# Patient Record
Sex: Male | Born: 1970 | Race: White | Hispanic: No | Marital: Married | Smoking: Current every day smoker
Health system: Southern US, Community
[De-identification: ages and names within clinical notes are randomized; demographics above are authoritative.]

## PROBLEM LIST (undated history)

## (undated) DIAGNOSIS — S43006A Unspecified dislocation of unspecified shoulder joint, initial encounter: Secondary | ICD-10-CM

## (undated) DIAGNOSIS — F1020 Alcohol dependence, uncomplicated: Secondary | ICD-10-CM

## (undated) HISTORY — PX: APPENDECTOMY: SHX54

---

## 2001-08-06 ENCOUNTER — Ambulatory Visit (HOSPITAL_COMMUNITY): Admission: RE | Admit: 2001-08-06 | Discharge: 2001-08-06 | Payer: Self-pay | Admitting: Internal Medicine

## 2001-08-06 ENCOUNTER — Encounter: Payer: Self-pay | Admitting: Internal Medicine

## 2015-07-13 ENCOUNTER — Emergency Department (HOSPITAL_COMMUNITY)
Admission: EM | Admit: 2015-07-13 | Discharge: 2015-07-13 | Disposition: A | Discharge status: Home / Self Care | Payer: Managed Care, Other (non HMO) | Attending: Emergency Medicine | Admitting: Emergency Medicine

## 2015-07-13 ENCOUNTER — Encounter (HOSPITAL_COMMUNITY): Payer: Self-pay | Admitting: Emergency Medicine

## 2015-07-13 ENCOUNTER — Emergency Department (HOSPITAL_COMMUNITY): Payer: Managed Care, Other (non HMO)

## 2015-07-13 DIAGNOSIS — X58XXXA Exposure to other specified factors, initial encounter: Secondary | ICD-10-CM | POA: Diagnosis not present

## 2015-07-13 DIAGNOSIS — R000 Tachycardia, unspecified: Secondary | ICD-10-CM | POA: Insufficient documentation

## 2015-07-13 DIAGNOSIS — Y998 Other external cause status: Secondary | ICD-10-CM | POA: Insufficient documentation

## 2015-07-13 DIAGNOSIS — F17200 Nicotine dependence, unspecified, uncomplicated: Secondary | ICD-10-CM | POA: Diagnosis not present

## 2015-07-13 DIAGNOSIS — S4991XA Unspecified injury of right shoulder and upper arm, initial encounter: Secondary | ICD-10-CM | POA: Diagnosis present

## 2015-07-13 DIAGNOSIS — M24411 Recurrent dislocation, right shoulder: Secondary | ICD-10-CM | POA: Insufficient documentation

## 2015-07-13 DIAGNOSIS — Y9389 Activity, other specified: Secondary | ICD-10-CM | POA: Diagnosis not present

## 2015-07-13 DIAGNOSIS — Y9289 Other specified places as the place of occurrence of the external cause: Secondary | ICD-10-CM | POA: Insufficient documentation

## 2015-07-13 HISTORY — DX: Unspecified dislocation of unspecified shoulder joint, initial encounter: S43.006A

## 2015-07-13 MED ORDER — IBUPROFEN 800 MG PO TABS
800.0000 mg | ORAL_TABLET | Freq: Once | ORAL | Status: AC
  Administered 2015-07-13: 800 mg via ORAL
  Filled 2015-07-13: qty 1

## 2015-07-13 NOTE — ED Notes (Signed)
Patient states he was stretching at home and dislocated his right shoulder. States he has history of same.

## 2015-07-13 NOTE — Discharge Instructions (Signed)

## 2015-07-13 NOTE — ED Provider Notes (Signed)
CSN: [id number]     Arrival date & time 07/13/15  2136 History  By signing my name below, I, [facility], attest that this documentation has been prepared under the direction and in the presence of Eber Hong, MD. Electronically Signed: Randell Patient, ED Scribe. 07/13/2015. 10:03 PM.   Chief Complaint  Patient presents with  . Shoulder Injury   The history is provided by the patient. No language interpreter was used.   HPI Comments: Maxwell Rosales is a 45 y.o. male with an hx of multiple shoulder dislocations who presents to the Emergency Department complaining of constant, moderately painful, worse with movement, right shoulder injury that occurred earlier today. Patient reports that he was sitting on the cough stretching his arm when he felt his right shoulder pop out of place, followed immediately by pain. Pain is worse with movement. Improved by keeping still.  Past Medical History  Diagnosis Date  . Shoulder dislocation    Past Surgical History  Procedure Laterality Date  . Appendectomy     History reviewed. No pertinent family history. Social History  Substance Use Topics  . Smoking status: Current Every Day Smoker  . Smokeless tobacco: None  . Alcohol Use: Yes     Comment: occasionally    Review of Systems  Musculoskeletal: Positive for arthralgias (right shoulder).  All other systems reviewed and are negative.     Allergies  Review of patient's allergies indicates no known allergies.  Home Medications   Prior to Admission medications   Not on File   BP 145/100 mmHg  Pulse 121  Temp(Src) 98.4 F (36.9 C) (Oral)  Resp 24  Ht 5\' 11"  (1.803 m)  Wt 185 lb (83.915 kg)  BMI 25.81 kg/m2  SpO2 90% Physical Exam  Constitutional: He appears well-developed and well-nourished. No distress.  HENT:  Head: Normocephalic and atraumatic.  Mouth/Throat: Oropharynx is clear and moist. No oropharyngeal exudate.  Eyes: Conjunctivae and EOM are normal.  Pupils are equal, round, and reactive to light. Right eye exhibits no discharge. Left eye exhibits no discharge. No scleral icterus.  Neck: Normal range of motion. Neck supple. No JVD present. No thyromegaly present.  Cardiovascular: Regular rhythm, normal heart sounds and intact distal pulses.  Tachycardia present.  Exam reveals no gallop and no friction rub.   No murmur heard. Strong pulses of the right radial. Tachycardic.  Pulmonary/Chest: Effort normal and breath sounds normal. No respiratory distress. He has no wheezes. He has no rales.  Abdominal: Soft. Bowel sounds are normal. He exhibits no distension and no mass. There is no tenderness.  Musculoskeletal: He exhibits tenderness. He exhibits no edema.  R shoulder with deformity c/w anterior, inferior shoulder dislocatin - holding the shoulder internally rotated and hanging in front of him - resists any movement due to severe pain  Lymphadenopathy:    He has no cervical adenopathy.  Neurological: He is alert. Coordination normal.  Skin: Skin is warm and dry. No rash noted. He is not diaphoretic. No erythema.  Psychiatric: He has a normal mood and affect. His behavior is normal.  Nursing note and vitals reviewed.   ED Course  Procedures  Shoulder Dislocation  DIAGNOSTIC STUDIES: Oxygen Saturation is 90% on RA, low by my interpretation.    COORDINATION OF CARE: 10:03 PM Per formed right shoulder retraction. Will order sling. Discussed treatment plan with pt at bedside and pt agreed to plan.  Labs Review Labs Reviewed - No data to display  Imaging Review No results  found. I have personally reviewed and evaluated these images and lab results as part of my medical decision-making.  Procedure:  Dislocation Reduction of right Shoulder  Consent:  Description of the procedures as well as Risks of procedure as well as the alternatives and risks of each were explained to the (patient/caregiver).  who has verbally expressed their  understanding.   verbal consent given by  the patient.  Imaging reviewed, anatomic site of dislocation identified and marked, Patient identity confirmed by arm band and with hospital identification number as well as verbally with patient.  Time Out performed.  Patient was prepped and draped in the usual sterile fashion. Sedation used No..  Sedation type: Moderate.  Type of Sedation used: none. Total sedation time: none .  Reduction method: traction.  Vitals were monitored through the procedure.  Complications: none.  Pt tolerated the procedure without complaints and returned to baseline without difficulty.  Post reduction images were obtained confirming successful reduction of dislocation.  Immobilization applied, Neurovascular status reevaluated and is good with normal pulses, sensation and good capillary refill of the affected extremity.   MDM   Final diagnoses:  None    Well appearing on d/c - initially xray showed that the pt had dislocation - however we had successful reduction clinically on exam and he was able to move the shoulder at baseline after the reduction attempt - no medication was necessary and the pt was immobilized with a sling - normal neurovasc exam after reduction - can f/u with ortho -   I personally performed the services described in this documentation, which was scribed in my presence. The recorded information has been reviewed and is accurate.    Eber HongBrian Elvin Banker, MD 07/15/15 [phone]

## 2015-07-15 ENCOUNTER — Encounter: Payer: Self-pay | Admitting: *Deleted

## 2015-07-15 ENCOUNTER — Emergency Department
Admission: EM | Admit: 2015-07-15 | Discharge: 2015-07-15 | Disposition: A | Discharge status: Home / Self Care | Payer: Managed Care, Other (non HMO) | Attending: Student | Admitting: Student

## 2015-07-15 ENCOUNTER — Emergency Department: Payer: Managed Care, Other (non HMO)

## 2015-07-15 DIAGNOSIS — F17200 Nicotine dependence, unspecified, uncomplicated: Secondary | ICD-10-CM | POA: Insufficient documentation

## 2015-07-15 DIAGNOSIS — M24411 Recurrent dislocation, right shoulder: Secondary | ICD-10-CM | POA: Diagnosis present

## 2015-07-15 MED ORDER — ONDANSETRON HCL 4 MG/2ML IJ SOLN
4.0000 mg | Freq: Once | INTRAMUSCULAR | Status: AC
  Administered 2015-07-15: 4 mg via INTRAVENOUS
  Filled 2015-07-15: qty 2

## 2015-07-15 MED ORDER — HYDROMORPHONE HCL 1 MG/ML IJ SOLN
1.0000 mg | Freq: Once | INTRAMUSCULAR | Status: AC
  Administered 2015-07-15: 1 mg via INTRAVENOUS
  Filled 2015-07-15: qty 1

## 2015-07-15 MED ORDER — OXYCODONE-ACETAMINOPHEN 5-325 MG PO TABS
ORAL_TABLET | ORAL | Status: AC
  Filled 2015-07-15: qty 1

## 2015-07-15 MED ORDER — OXYCODONE-ACETAMINOPHEN 5-325 MG PO TABS
1.0000 | ORAL_TABLET | Freq: Once | ORAL | Status: AC
  Administered 2015-07-15: 1 via ORAL

## 2015-07-15 MED ORDER — OXYCODONE HCL 5 MG PO TABS: 5.0000 mg | ORAL_TABLET | Freq: Four times a day (QID) | ORAL | Status: DC | PRN

## 2015-07-15 MED ORDER — DIAZEPAM 5 MG/ML IJ SOLN
1.0000 mg | Freq: Once | INTRAMUSCULAR | Status: AC
  Administered 2015-07-15: 1 mg via INTRAVENOUS

## 2015-07-15 MED ORDER — DIAZEPAM 5 MG/ML IJ SOLN
INTRAMUSCULAR | Status: AC
  Administered 2015-07-15: 1 mg via INTRAVENOUS
  Filled 2015-07-15: qty 2

## 2015-07-15 NOTE — Discharge Instructions (Signed)
Shoulder Dislocation °A shoulder dislocation happens when the upper arm bone (humerus) moves out of the shoulder joint. The shoulder joint is the part of the shoulder where the humerus, shoulder blade (scapula), and collarbone (clavicle) meet. °CAUSES °This condition is often caused by: °· A fall. °· A hit to the shoulder. °· A forceful movement of the shoulder. °RISK FACTORS °This condition is more likely to develop in people who play sports. °SYMPTOMS °Symptoms of this condition include: °· Deformity of the shoulder. °· Intense pain. °· Inability to move the shoulder. °· Numbness, weakness, or tingling in your neck or down your arm. °· Bruising or swelling around your shoulder. °DIAGNOSIS °This condition is diagnosed with a physical exam. After the exam, tests may be done to check for related problems. Tests that may be done include: °· X-ray. This may be done to check for broken bones. °· MRI. This may be done to check for damage to the tissues around the shoulder. °· Electromyogram. This may be done to check for nerve damage. °TREATMENT °This condition is treated with a procedure to place the humerus back in the joint. This procedure is called a reduction. There are two types of reduction: °· Closed reduction. In this procedure, the humerus is placed back in the joint without surgery. The health care provider uses his or her hands to guide the bone back into place. °· Open reduction. In this procedure, the humerus is placed back in the joint with surgery. An open reduction may be recommended if: °¨ You have a weak shoulder joint or weak ligaments. °¨ You have had more than one shoulder dislocation. °¨ The nerves or blood vessels around your shoulder have been damaged. °After the humerus is placed back into the joint, your arm will be placed in a splint or sling to prevent it from moving. You will need to wear the splint or sling until your shoulder heals. When the splint or sling is removed, you may have  physical therapy to help improve the range of motion in your shoulder joint. °HOME CARE INSTRUCTIONS °If You Have a Splint or Sling: °· Wear it as told by your health care provider. Remove it only as told by your health care provider. °· Loosen it if your fingers become numb and tingle, or if they turn cold and blue. °· Keep it clean and dry. °Bathing °· Do not take baths, swim, or use a hot tub until your health care provider approves. Ask your health care provider if you can take showers. You may only be allowed to take sponge baths for bathing. °· If your health care provider approves bathing and showering, cover your splint or sling with a watertight plastic bag to protect it from water. Do not let the splint or sling get wet. °Managing Pain, Stiffness, and Swelling °· If directed, apply ice to the injured area. °¨ Put ice in a plastic bag. °¨ Place a towel between your skin and the bag. °¨ Leave the ice on for 20 minutes, 2-3 times per day. °· Move your fingers often to avoid stiffness and to decrease swelling. °· Raise (elevate) the injured area above the level of your heart while you are sitting or lying down. °Driving °· Do not drive while wearing a splint or sling on a hand that you use for driving. °· Do not drive or operate heavy machinery while taking pain medicine. °Activity °· Return to your normal activities as told by your health care provider. Ask your   health care provider what activities are safe for you. °· Perform range-of-motion exercises only as told by your health care provider. °· Exercise your hand by squeezing a soft ball. This helps to decrease stiffness and swelling in your hand and wrist. °General Instructions °· Take over-the-counter and prescription medicines only as told by your health care provider. °· Do not use any tobacco products, including cigarettes, chewing tobacco, or e-cigarettes. Tobacco can delay bone and tissue healing. If you need help quitting, ask your health care  provider. °· Keep all follow-up visits as told by your health care provider. This is important. °SEEK MEDICAL CARE IF: °· Your splint or sling gets damaged. °SEEK IMMEDIATE MEDICAL CARE IF: °· Your pain gets worse rather than better. °· You lose feeling in your arm or hand. °· Your arm or hand becomes white and cold. °  °This information is not intended to replace advice given to you by your health care provider. Make sure you discuss any questions you have with your health care provider. °  °Document Released: 03/08/2001 Document Revised: 03/04/2015 Document Reviewed: 10/06/2014 °Elsevier Interactive Patient Education ©2016 [employer] ° °

## 2015-07-15 NOTE — ED Notes (Signed)
Shoulder repositioned per MD with weights. Pt currently more comfortable, able to touch opposite shoulder. Shoulder immobilizer applied.

## 2015-07-15 NOTE — ED Provider Notes (Signed)
[facility] Emergency Department Provider Note  ____________________________________________  Time seen: Approximately 2:25 PM  I have reviewed the triage vital signs and the nursing notes.   HISTORY  Chief Complaint Dislocation    HPI CHRISTOPHOR Rosales is a 45 y.o. male with history of recurrent right shoulder dislocations who presents for spontaneous right shoulder dislocation which occurred at approximately 10:30 AM this morning, pain was sudden in onset, has been constant since onset and is worse with movement. The patient reports that he was seen at outside hospital 2 days ago for shoulder dislocation. His shoulder was put back in place and he was sent home with a sling. Today he was not having any pains and was not wearing a sling, reached up to grab something and felt his shoulder dislocate. No other injuries. No chest pain, no difficulty breathing. He has otherwise been his usual state of health.   Past Medical History  Diagnosis Date  . Shoulder dislocation     There are no active problems to display for this patient.   Past Surgical History  Procedure Laterality Date  . Appendectomy      No current outpatient prescriptions on file.  Allergies Review of patient's allergies indicates no known allergies.  History reviewed. No pertinent family history.  Social History Social History  Substance Use Topics  . Smoking status: Current Every Day Smoker  . Smokeless tobacco: None  . Alcohol Use: Yes     Comment: occasionally    Review of Systems Constitutional: No fever/chills Eyes: No visual changes. ENT: No sore throat. Cardiovascular: Denies chest pain. Respiratory: Denies shortness of breath. Gastrointestinal: No abdominal pain.  No nausea, no vomiting.  No diarrhea.  No constipation. Genitourinary: Negative for dysuria. Musculoskeletal: Negative for back pain. Skin: Negative for rash. Neurological: Negative for headaches, focal  weakness or numbness.  10-point ROS otherwise negative.  ____________________________________________   PHYSICAL EXAM:  VITAL SIGNS: ED Triage Vitals  Enc Vitals Group     BP 07/15/15 1121 157/108 mmHg     Pulse Rate 07/15/15 1121 98     Resp 07/15/15 1121 22     Temp 07/15/15 1121 97.9 F (36.6 C)     Temp Source 07/15/15 1121 Oral     SpO2 07/15/15 1121 99 %     Weight 07/15/15 1121 185 lb (83.915 kg)     Height 07/15/15 1121  (1.803 m)     Head Cir --      Peak Flow --      Pain Score 07/15/15 1121 9     Pain Loc --      Pain Edu? --      Excl. in GC? --     Constitutional: Alert and oriented. In distress secondary to pain. Eyes: Conjunctivae are normal. PERRL. EOMI. Head: Atraumatic. Nose: No congestion/rhinnorhea. Mouth/Throat: Mucous membranes are moist.  Oropharynx non-erythematous. Neck: No stridor.   Cardiovascular: Normal rate, regular rhythm. Grossly normal heart sounds.  Good peripheral circulation. Respiratory: Normal respiratory effort.  No retractions. Lungs CTAB. Gastrointestinal: Soft and nontender. No distention. No CVA tenderness. Genitourinary: deferred Musculoskeletal: Tenderness with bony deformity of the right shoulder, the right shoulder is tender to palpation. He has limited range of motion at the right shoulder. Right radial, ulnar, median nerve are intact. Neurologic:  Normal speech and language. No gross focal neurologic deficits are appreciated.  Skin:  Skin is warm, dry and intact. No rash noted. Psychiatric: Mood and affect are normal. Speech  and behavior are normal.  ____________________________________________   LABS (all labs ordered are listed, but only abnormal results are displayed)  Labs Reviewed - No data to display ____________________________________________  EKG  none ____________________________________________  RADIOLOGY  Xray right shoulder FINDINGS: Similar anterior glenohumeral dislocation identified.  Probable mild chronic Hill-Sachs deformity of the humeral head. No obvious acute fracture identified. Mild degenerative changes are seen involving the [facility] joint.  IMPRESSION: Recurrent anterior glenohumeral dislocation. 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  Xray right shoulder IMPRESSION: Successful reduction of previously described anterior dislocation. Mildly displaced fracture is seen overlying the greater tuberosity of proximal humerus.   ____________________________________________   PROCEDURES  Procedure(s) performed: Yes  Reduction of dislocation Date/Time: 4:00 PM Performed by: Gayla Doss Authorized by: Toney Rakes A Consent: Verbal consent obtained. Risks and benefits: risks, benefits and alternatives were discussed Consent given by: patient Required items: required blood products, implants, devices, and special equipment available Time out: Immediately prior to procedure a "time out" was called to verify the correct patient, procedure, equipment, support staff and site/side marked as required.  Patient sedated: no  Vitals: Vital signs were monitored during sedation. Patient tolerance: Patient tolerated the procedure well with no immediate complications. Joint: right shoulder Reduction technique: FaRES method, minor scapular manipulation with hanging weights   Critical Care performed: No  ____________________________________________   INITIAL IMPRESSION / ASSESSMENT AND PLAN / ED COURSE  Pertinent labs & imaging results that were available during my care of the patient were reviewed by me and considered in my medical decision making (see chart for details).  Maxwell Rosales is a 46 y.o. male with history of recurrent right shoulder dislocations who presents for spontaneous right shoulder dislocation which occurred at approximately 10:30 AM this morning. On exam, he is in distress due to pain. He has an obvious deformity of the right shoulder and  x-ray confirms anterior shoulder dislocation. Neurovascularly intact in the right hand. Clinically appears reduced at 3:08 PM after manipulation. Awaiting  Post-reduction plain films. Anticipate discharge with ortho follow-up.  ----------------------------------------- 3:58 PM on 07/15/2015 -----------------------------------------  Patient continues to appear well. Shoulder immobilizer in place. Without the immobilizer, he is fully able to adduct at the right shoulder. Plain films show successful post reduction of the anterior dislocation. There is a notable mildly displaced greater tuberosity fracture of the proximal humerus. I discussed this with Dr. Joice Lofts of orthopedic surgery who has reviewed the plain films. He reports that this may be chronic and related to the Hill-sachs deformity but could also be acute. He recommends no additional testing here but recommends close follow-up in the orthopedics office for repeat assessment and likely CT scan at that time. I discussed this with the patient. He remains neurovascularly intact in the right hand. DC with return precautions and close or for follow-up. He is comfortable with the discharge plan. ____________________________________________   FINAL CLINICAL IMPRESSION(S) / ED DIAGNOSES  Final diagnoses:  Shoulder dislocation, recurrent, right      Gayla Doss, MD 07/15/15 1600

## 2015-07-15 NOTE — ED Notes (Signed)
States he was seen at [facility] 2 days ago and had his shoulder put back in place and given a sling but pt did not wear sling and reached for something and felt shoulder pop back out

## 2015-07-15 NOTE — ED Notes (Signed)
MD at bedside. 

## 2017-05-03 IMAGING — CR DG SHOULDER 2+V*R*
2 series · 2 of 2 positions shown · non-contrast
Comparison: Same day.

CLINICAL DATA: Status post reduction of dislocation.

EXAM:
RIGHT SHOULDER - 2+ VIEW

[shoulder grashey]
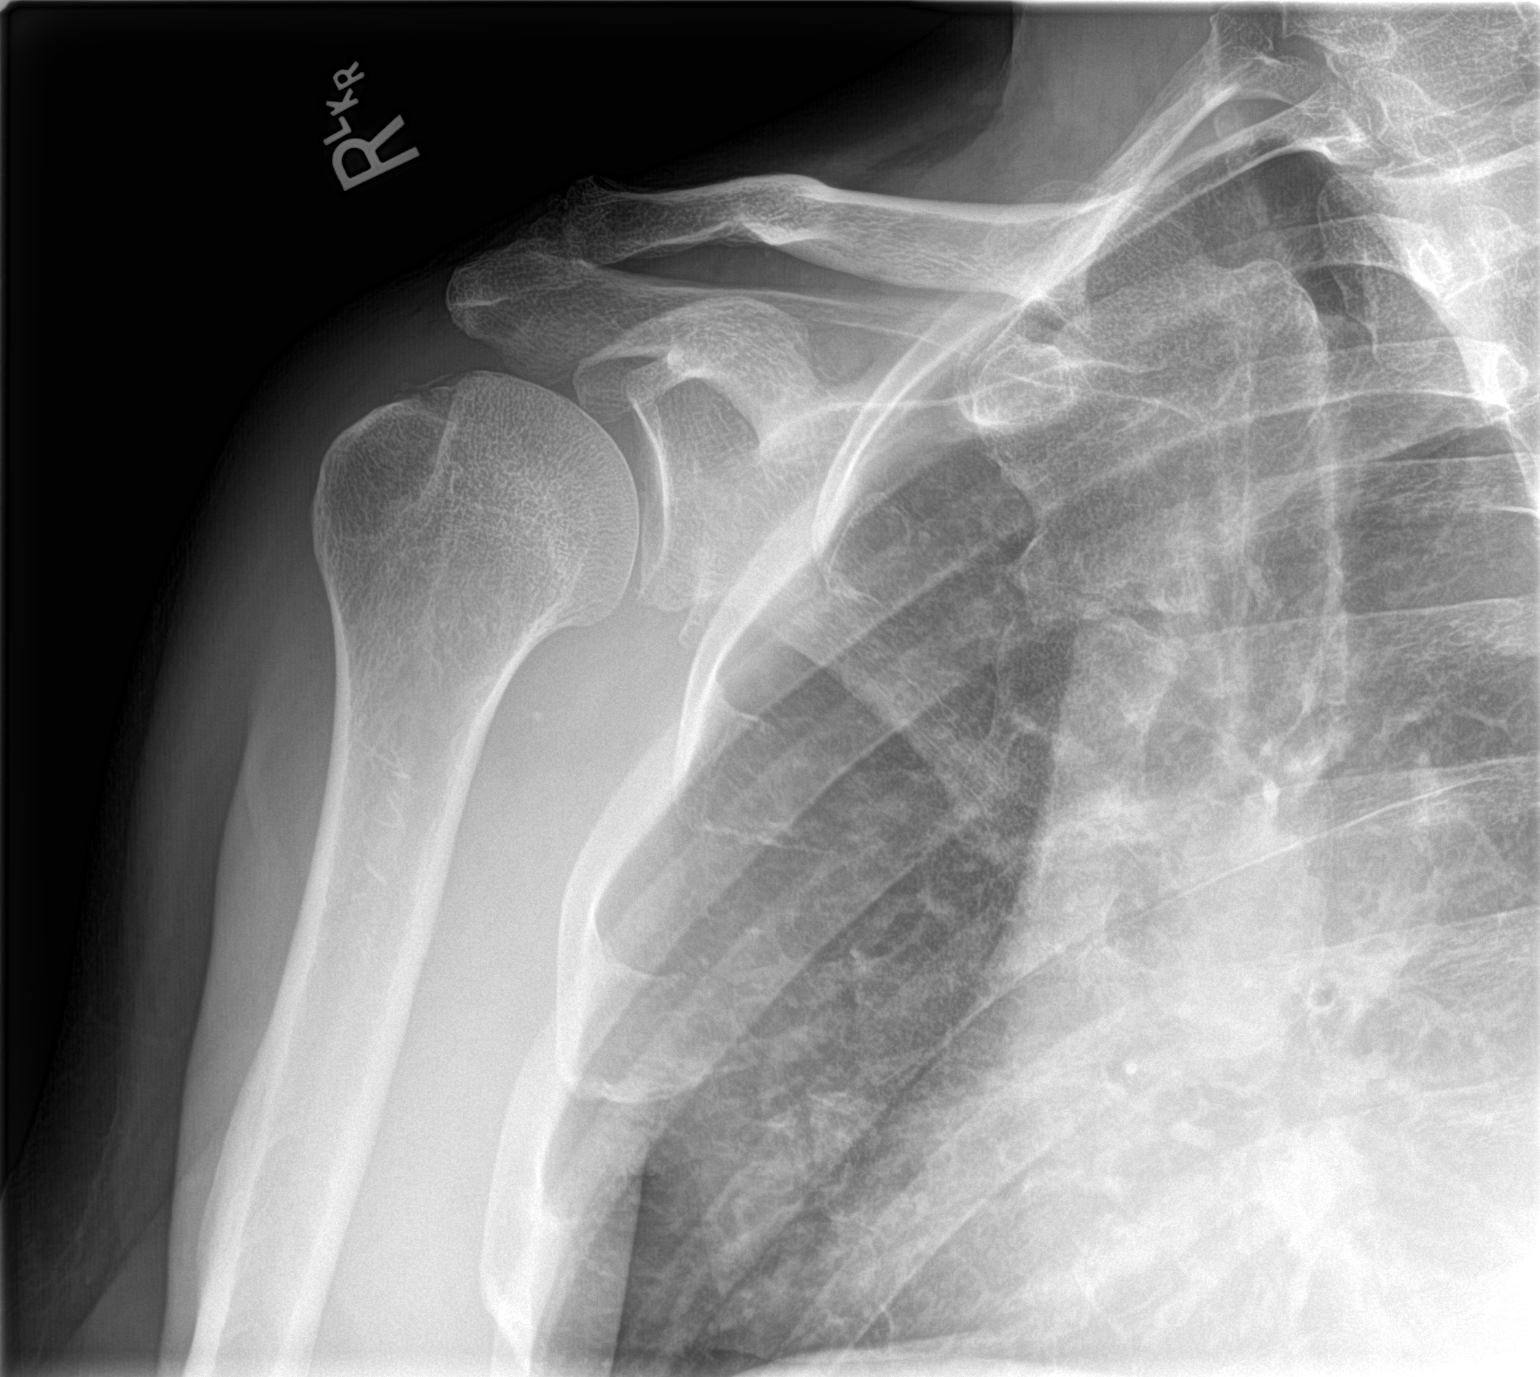

[shoulder y view]
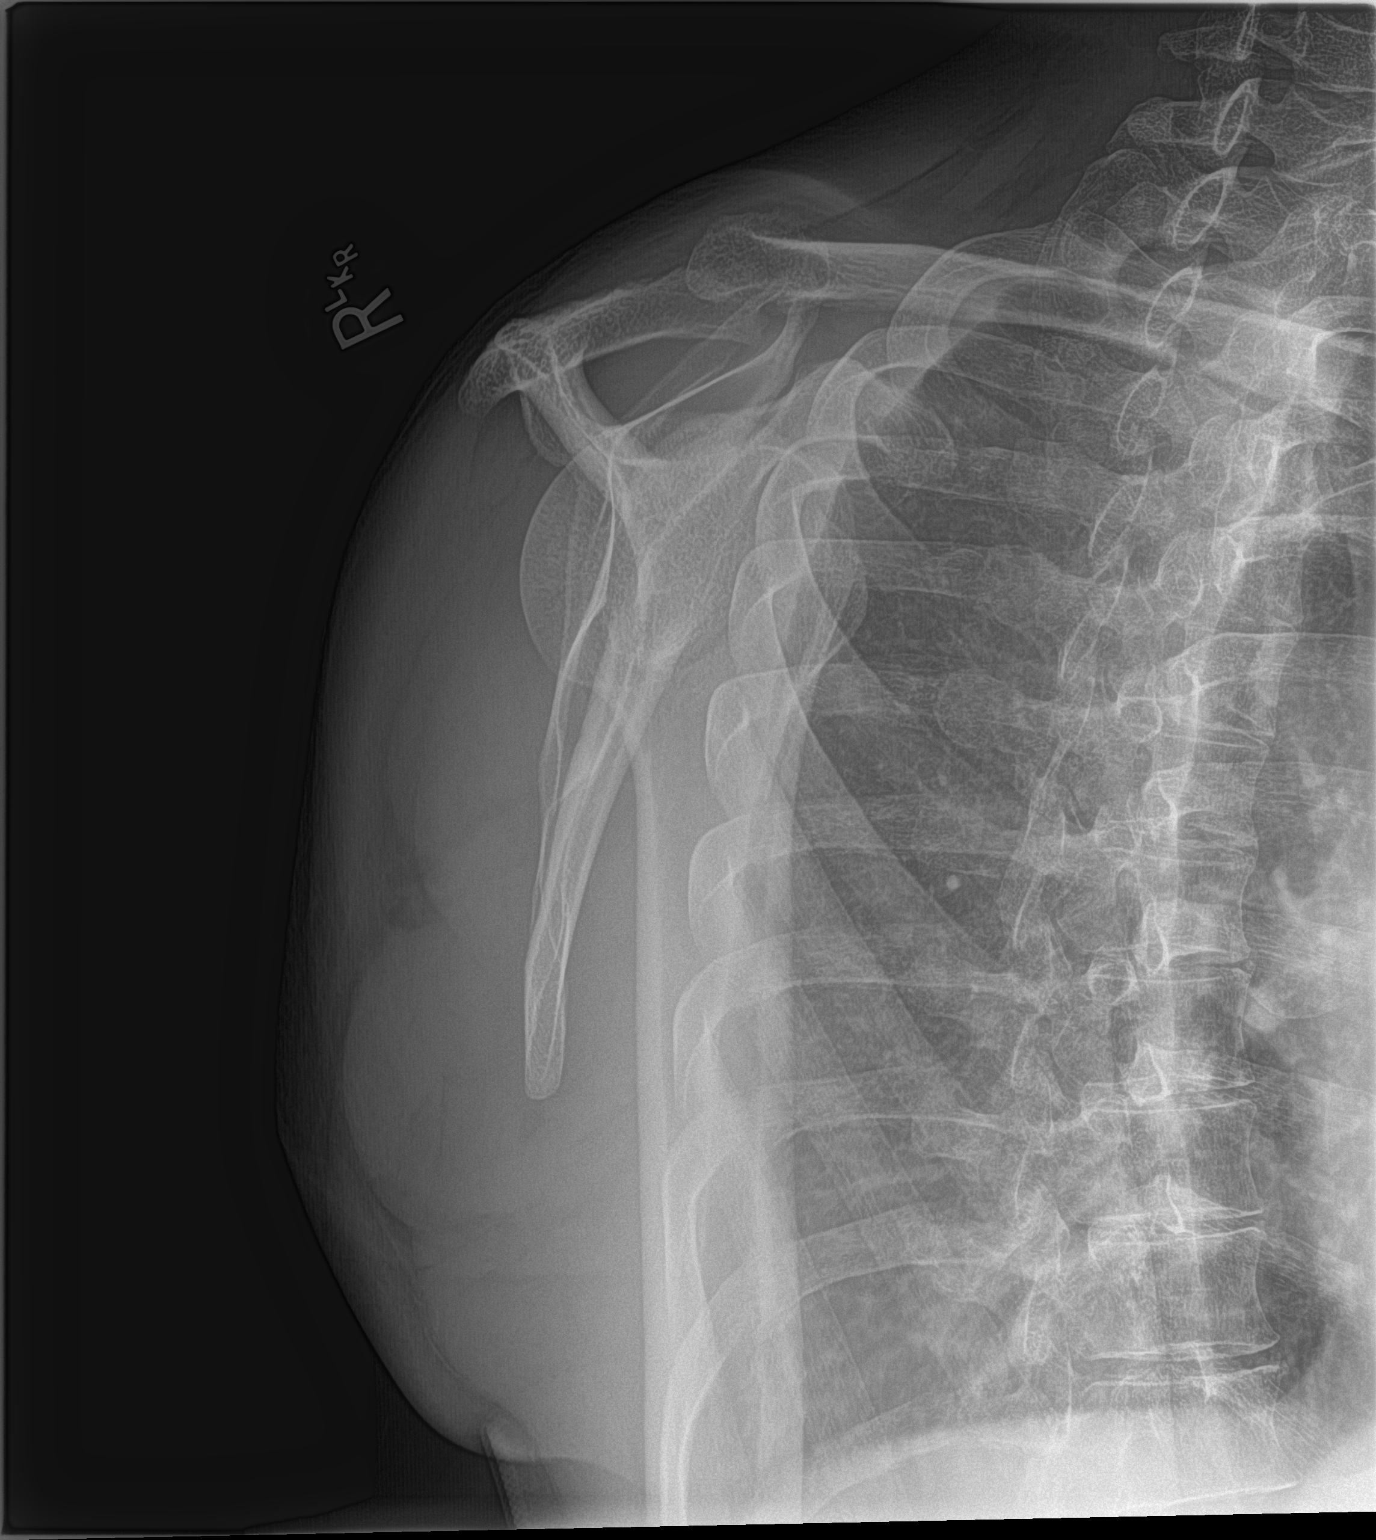

[2 of 2 positions shown; findings below may reference images not displayed]

FINDINGS: Previously described anterior dislocation of right proximal humerus
has been successfully reduced. There does appear to be a mildly
displaced fracture overlying the greater tuberosity of proximal
humerus.
IMPRESSION: Successful reduction of previously described anterior dislocation.
Mildly displaced fracture is seen overlying the greater tuberosity
of proximal humerus.

## 2024-02-13 ENCOUNTER — Encounter (HOSPITAL_COMMUNITY): Payer: Self-pay | Admitting: Emergency Medicine

## 2024-02-13 ENCOUNTER — Emergency Department (HOSPITAL_COMMUNITY)

## 2024-02-13 ENCOUNTER — Inpatient Hospital Stay (HOSPITAL_COMMUNITY)
Admission: EM | Admit: 2024-02-13 | Discharge: 2024-02-17 | DRG: 897 | Disposition: A | Discharge status: Home / Self Care | Attending: Family Medicine | Admitting: Family Medicine

## 2024-02-13 ENCOUNTER — Other Ambulatory Visit: Payer: Self-pay

## 2024-02-13 DIAGNOSIS — I493 Ventricular premature depolarization: Secondary | ICD-10-CM | POA: Diagnosis present

## 2024-02-13 DIAGNOSIS — F10930 Alcohol use, unspecified with withdrawal, uncomplicated: Secondary | ICD-10-CM | POA: Diagnosis not present

## 2024-02-13 DIAGNOSIS — Z716 Tobacco abuse counseling: Secondary | ICD-10-CM

## 2024-02-13 DIAGNOSIS — K760 Fatty (change of) liver, not elsewhere classified: Secondary | ICD-10-CM | POA: Diagnosis present

## 2024-02-13 DIAGNOSIS — F10139 Alcohol abuse with withdrawal, unspecified: Principal | ICD-10-CM | POA: Diagnosis present

## 2024-02-13 DIAGNOSIS — D696 Thrombocytopenia, unspecified: Secondary | ICD-10-CM | POA: Diagnosis present

## 2024-02-13 DIAGNOSIS — M79605 Pain in left leg: Secondary | ICD-10-CM

## 2024-02-13 DIAGNOSIS — R112 Nausea with vomiting, unspecified: Secondary | ICD-10-CM

## 2024-02-13 DIAGNOSIS — M79604 Pain in right leg: Secondary | ICD-10-CM

## 2024-02-13 DIAGNOSIS — I4729 Other ventricular tachycardia: Secondary | ICD-10-CM

## 2024-02-13 DIAGNOSIS — I350 Nonrheumatic aortic (valve) stenosis: Secondary | ICD-10-CM | POA: Diagnosis present

## 2024-02-13 DIAGNOSIS — R7989 Other specified abnormal findings of blood chemistry: Secondary | ICD-10-CM

## 2024-02-13 DIAGNOSIS — I10 Essential (primary) hypertension: Secondary | ICD-10-CM | POA: Diagnosis present

## 2024-02-13 DIAGNOSIS — I447 Left bundle-branch block, unspecified: Secondary | ICD-10-CM | POA: Diagnosis present

## 2024-02-13 DIAGNOSIS — E8729 Other acidosis: Secondary | ICD-10-CM | POA: Diagnosis present

## 2024-02-13 DIAGNOSIS — Z720 Tobacco use: Secondary | ICD-10-CM | POA: Diagnosis present

## 2024-02-13 DIAGNOSIS — F10939 Alcohol use, unspecified with withdrawal, unspecified: Secondary | ICD-10-CM | POA: Diagnosis not present

## 2024-02-13 DIAGNOSIS — E876 Hypokalemia: Secondary | ICD-10-CM | POA: Diagnosis present

## 2024-02-13 DIAGNOSIS — F1090 Alcohol use, unspecified, uncomplicated: Secondary | ICD-10-CM

## 2024-02-13 DIAGNOSIS — I2510 Atherosclerotic heart disease of native coronary artery without angina pectoris: Secondary | ICD-10-CM | POA: Diagnosis present

## 2024-02-13 DIAGNOSIS — I7409 Other arterial embolism and thrombosis of abdominal aorta: Secondary | ICD-10-CM | POA: Diagnosis present

## 2024-02-13 DIAGNOSIS — Z7982 Long term (current) use of aspirin: Secondary | ICD-10-CM

## 2024-02-13 DIAGNOSIS — I739 Peripheral vascular disease, unspecified: Secondary | ICD-10-CM | POA: Diagnosis present

## 2024-02-13 DIAGNOSIS — K7010 Alcoholic hepatitis without ascites: Secondary | ICD-10-CM | POA: Diagnosis present

## 2024-02-13 DIAGNOSIS — I472 Ventricular tachycardia, unspecified: Secondary | ICD-10-CM | POA: Diagnosis present

## 2024-02-13 DIAGNOSIS — Q251 Coarctation of aorta: Secondary | ICD-10-CM

## 2024-02-13 DIAGNOSIS — F17200 Nicotine dependence, unspecified, uncomplicated: Secondary | ICD-10-CM | POA: Diagnosis present

## 2024-02-13 DIAGNOSIS — I7143 Infrarenal abdominal aortic aneurysm, without rupture: Secondary | ICD-10-CM | POA: Diagnosis present

## 2024-02-13 DIAGNOSIS — I2489 Other forms of acute ischemic heart disease: Secondary | ICD-10-CM | POA: Diagnosis present

## 2024-02-13 DIAGNOSIS — G629 Polyneuropathy, unspecified: Secondary | ICD-10-CM | POA: Diagnosis present

## 2024-02-13 LAB — COMPREHENSIVE METABOLIC PANEL WITH GFR
ALT: 108 U/L — ABNORMAL HIGH (ref 0–44)
AST: 171 U/L — ABNORMAL HIGH (ref 15–41)
Albumin: 3.2 g/dL — ABNORMAL LOW (ref 3.5–5.0)
Alkaline Phosphatase: 128 U/L — ABNORMAL HIGH (ref 38–126)
Anion gap: 19 — ABNORMAL HIGH (ref 5–15)
BUN: 7 mg/dL (ref 6–20)
CO2: 22 mmol/L (ref 22–32)
Calcium: 8.4 mg/dL — ABNORMAL LOW (ref 8.9–10.3)
Chloride: 99 mmol/L (ref 98–111)
Creatinine, Ser: 0.59 mg/dL — ABNORMAL LOW (ref 0.61–1.24)
GFR, Estimated: >60 mL/min (ref >60)
Glucose, Bld: 86 mg/dL (ref 70–99)
Potassium: 3.3 mmol/L — ABNORMAL LOW (ref 3.5–5.1)
Sodium: 140 mmol/L (ref 135–145)
Total Bilirubin: 3.1 mg/dL — ABNORMAL HIGH (ref 0.0–1.2)
Total Protein: 6.4 g/dL — ABNORMAL LOW (ref 6.5–8.1)

## 2024-02-13 LAB — URINALYSIS, ROUTINE W REFLEX MICROSCOPIC
Bilirubin Urine: NEGATIVE
Glucose, UA: NEGATIVE mg/dL
Hgb urine dipstick: NEGATIVE
Ketones, ur: 20 mg/dL — AB
Leukocytes,Ua: NEGATIVE
Nitrite: NEGATIVE
Protein, ur: NEGATIVE mg/dL
Specific Gravity, Urine: >1.046 — ABNORMAL HIGH (ref 1.005–1.030)
pH: 5 (ref 5.0–8.0)

## 2024-02-13 LAB — TSH: TSH: 6.117 u[IU]/mL — ABNORMAL HIGH (ref 0.350–4.500)

## 2024-02-13 LAB — CBC WITH DIFFERENTIAL/PLATELET
Abs Granulocyte: 4.7 K/uL (ref 1.5–6.5)
Abs Immature Granulocytes: 0.02 K/uL (ref 0.00–0.07)
Basophils Absolute: 0.1 K/uL (ref 0.0–0.1)
Basophils Relative: 1 %
Eosinophils Absolute: 0 K/uL (ref 0.0–0.5)
Eosinophils Relative: 0 %
HCT: 41.8 % (ref 39.0–52.0)
Hemoglobin: 15.1 g/dL (ref 13.0–17.0)
Immature Granulocytes: 0 %
Lymphocytes Relative: 21 %
Lymphs Abs: 1.5 K/uL (ref 0.7–4.0)
MCH: 38.6 pg — ABNORMAL HIGH (ref 26.0–34.0)
MCHC: 36.1 g/dL — ABNORMAL HIGH (ref 30.0–36.0)
MCV: 106.9 fL — ABNORMAL HIGH (ref 80.0–100.0)
Monocytes Absolute: 0.6 K/uL (ref 0.1–1.0)
Monocytes Relative: 8 %
Neutro Abs: 4.7 K/uL (ref 1.7–7.7)
Neutrophils Relative %: 70 %
Platelets: 106 K/uL — ABNORMAL LOW (ref 150–400)
RBC: 3.91 MIL/uL — ABNORMAL LOW (ref 4.22–5.81)
RDW: 13.4 % (ref 11.5–15.5)
WBC: 6.8 K/uL (ref 4.0–10.5)
nRBC: 0 % (ref 0.0–0.2)

## 2024-02-13 LAB — PROTIME-INR
INR: 0.9 (ref 0.8–1.2)
Prothrombin Time: 13.2 s (ref 11.4–15.2)

## 2024-02-13 LAB — RAPID HIV SCREEN (HIV 1/2 AB+AG)
HIV 1/2 Antibodies: NONREACTIVE
HIV-1 P24 Antigen - HIV24: NONREACTIVE

## 2024-02-13 LAB — RAPID URINE DRUG SCREEN, HOSP PERFORMED
Amphetamines: NOT DETECTED
Barbiturates: NOT DETECTED
Benzodiazepines: NOT DETECTED
Cocaine: NOT DETECTED
Opiates: NOT DETECTED
Tetrahydrocannabinol: NOT DETECTED

## 2024-02-13 LAB — TROPONIN I (HIGH SENSITIVITY)
Troponin I (High Sensitivity): 44 ng/L — ABNORMAL HIGH (ref <18)
Troponin I (High Sensitivity): 43 ng/L — ABNORMAL HIGH (ref <18)

## 2024-02-13 LAB — ETHANOL: Alcohol, Ethyl (B): 205 mg/dL — ABNORMAL HIGH (ref <15)

## 2024-02-13 LAB — T4, FREE: Free T4: 0.97 ng/dL (ref 0.61–1.12)

## 2024-02-13 LAB — BRAIN NATRIURETIC PEPTIDE: B Natriuretic Peptide: 308 pg/mL — ABNORMAL HIGH (ref 0.0–100.0)

## 2024-02-13 LAB — LIPASE, BLOOD: Lipase: 30 U/L (ref 11–51)

## 2024-02-13 LAB — HEMOGLOBIN A1C
Hgb A1c MFr Bld: 4.7 % — ABNORMAL LOW (ref 4.8–5.6)
Mean Plasma Glucose: 88.19 mg/dL

## 2024-02-13 LAB — VITAMIN B12: Vitamin B-12: 582 pg/mL (ref 180–914)

## 2024-02-13 LAB — CBG MONITORING, ED: Glucose-Capillary: 84 mg/dL (ref 70–99)

## 2024-02-13 MED ORDER — LORAZEPAM 1 MG PO TABS
0.0000 mg | ORAL_TABLET | Freq: Four times a day (QID) | ORAL | Status: DC
  Administered 2024-02-13: 1 mg via ORAL
  Filled 2024-02-13: qty 1

## 2024-02-13 MED ORDER — LORAZEPAM 2 MG/ML IJ SOLN
0.0000 mg | Freq: Four times a day (QID) | INTRAMUSCULAR | Status: DC
  Administered 2024-02-13: 2 mg via INTRAVENOUS
  Filled 2024-02-13: qty 1

## 2024-02-13 MED ORDER — CHLORDIAZEPOXIDE HCL 25 MG PO CAPS
25.0000 mg | ORAL_CAPSULE | Freq: Four times a day (QID) | ORAL | Status: AC
  Administered 2024-02-13 – 2024-02-14 (×6): 25 mg via ORAL
  Filled 2024-02-13 (×6): qty 1

## 2024-02-13 MED ORDER — LORAZEPAM 2 MG/ML IJ SOLN
0.0000 mg | Freq: Four times a day (QID) | INTRAMUSCULAR | Status: AC
  Administered 2024-02-13 – 2024-02-14 (×3): 2 mg via INTRAVENOUS
  Administered 2024-02-15 (×2): 1 mg via INTRAVENOUS
  Filled 2024-02-13 (×6): qty 1

## 2024-02-13 MED ORDER — ONDANSETRON HCL 4 MG PO TABS: 4.0000 mg | ORAL_TABLET | Freq: Four times a day (QID) | ORAL | Status: DC | PRN

## 2024-02-13 MED ORDER — LORAZEPAM 2 MG/ML IJ SOLN: 0.0000 mg | Freq: Two times a day (BID) | INTRAMUSCULAR | Status: DC

## 2024-02-13 MED ORDER — SODIUM CHLORIDE 0.9% FLUSH
3.0000 mL | Freq: Two times a day (BID) | INTRAVENOUS | Status: DC
  Administered 2024-02-14 – 2024-02-17 (×7): 3 mL via INTRAVENOUS

## 2024-02-13 MED ORDER — THIAMINE MONONITRATE 100 MG PO TABS
100.0000 mg | ORAL_TABLET | Freq: Every day | ORAL | Status: DC
  Administered 2024-02-14 – 2024-02-17 (×4): 100 mg via ORAL
  Filled 2024-02-13 (×4): qty 1

## 2024-02-13 MED ORDER — LORAZEPAM 1 MG PO TABS
1.0000 mg | ORAL_TABLET | ORAL | Status: AC | PRN
  Administered 2024-02-15: 1 mg via ORAL
  Filled 2024-02-13: qty 1

## 2024-02-13 MED ORDER — ONDANSETRON HCL 4 MG/2ML IJ SOLN: 4.0000 mg | Freq: Four times a day (QID) | INTRAMUSCULAR | Status: DC | PRN

## 2024-02-13 MED ORDER — FOLIC ACID 1 MG PO TABS
1.0000 mg | ORAL_TABLET | Freq: Every day | ORAL | Status: DC
  Administered 2024-02-13 – 2024-02-17 (×5): 1 mg via ORAL
  Filled 2024-02-13 (×5): qty 1

## 2024-02-13 MED ORDER — LORAZEPAM 2 MG/ML IJ SOLN
1.0000 mg | INTRAMUSCULAR | Status: AC | PRN
  Administered 2024-02-13 – 2024-02-14 (×6): 2 mg via INTRAVENOUS
  Administered 2024-02-15: 1 mg via INTRAVENOUS
  Administered 2024-02-15: 2 mg via INTRAVENOUS
  Administered 2024-02-16 (×2): 1 mg via INTRAVENOUS
  Filled 2024-02-13 (×8): qty 1

## 2024-02-13 MED ORDER — LORAZEPAM 2 MG/ML IJ SOLN
0.0000 mg | Freq: Two times a day (BID) | INTRAMUSCULAR | Status: DC
  Administered 2024-02-16: 2 mg via INTRAVENOUS
  Administered 2024-02-16: 1 mg via INTRAVENOUS
  Filled 2024-02-13 (×3): qty 1

## 2024-02-13 MED ORDER — ACETAMINOPHEN 325 MG PO TABS: 650.0000 mg | ORAL_TABLET | ORAL | Status: DC | PRN

## 2024-02-13 MED ORDER — ONDANSETRON HCL 4 MG/2ML IJ SOLN
4.0000 mg | Freq: Once | INTRAMUSCULAR | Status: DC
  Filled 2024-02-13: qty 2

## 2024-02-13 MED ORDER — LORAZEPAM 1 MG PO TABS: 0.0000 mg | ORAL_TABLET | Freq: Two times a day (BID) | ORAL | Status: DC

## 2024-02-13 MED ORDER — NICOTINE 21 MG/24HR TD PT24
21.0000 mg | MEDICATED_PATCH | Freq: Once | TRANSDERMAL | Status: AC
  Administered 2024-02-13: 21 mg via TRANSDERMAL
  Filled 2024-02-13: qty 1

## 2024-02-13 MED ORDER — CHLORDIAZEPOXIDE HCL 25 MG PO CAPS: 25.0000 mg | ORAL_CAPSULE | Freq: Three times a day (TID) | ORAL | Status: DC

## 2024-02-13 MED ORDER — LACTATED RINGERS IV BOLUS
1000.0000 mL | Freq: Once | INTRAVENOUS | Status: AC
  Administered 2024-02-13: 1000 mL via INTRAVENOUS

## 2024-02-13 MED ORDER — ACETAMINOPHEN 650 MG RE SUPP
650.0000 mg | Freq: Four times a day (QID) | RECTAL | Status: DC | PRN
Start: 2024-02-13 — End: 2024-02-17

## 2024-02-13 MED ORDER — ACETAMINOPHEN 325 MG PO TABS: 650.0000 mg | ORAL_TABLET | Freq: Four times a day (QID) | ORAL | Status: DC | PRN

## 2024-02-13 MED ORDER — POTASSIUM CHLORIDE CRYS ER 20 MEQ PO TBCR
40.0000 meq | EXTENDED_RELEASE_TABLET | Freq: Once | ORAL | Status: AC
  Administered 2024-02-13: 40 meq via ORAL
  Filled 2024-02-13: qty 4

## 2024-02-13 MED ORDER — ASPIRIN 81 MG PO TBEC
81.0000 mg | DELAYED_RELEASE_TABLET | Freq: Every day | ORAL | Status: DC
  Administered 2024-02-14 – 2024-02-17 (×4): 81 mg via ORAL
  Filled 2024-02-13 (×4): qty 1

## 2024-02-13 MED ORDER — CHLORDIAZEPOXIDE HCL 25 MG PO CAPS: 25.0000 mg | ORAL_CAPSULE | ORAL | Status: DC

## 2024-02-13 MED ORDER — ADULT MULTIVITAMIN W/MINERALS CH
1.0000 | ORAL_TABLET | Freq: Every day | ORAL | Status: DC
  Administered 2024-02-13 – 2024-02-17 (×5): 1 via ORAL
  Filled 2024-02-13 (×5): qty 1

## 2024-02-13 MED ORDER — NICOTINE 21 MG/24HR TD PT24
21.0000 mg | MEDICATED_PATCH | Freq: Every day | TRANSDERMAL | Status: DC
  Administered 2024-02-13 – 2024-02-17 (×5): 21 mg via TRANSDERMAL
  Filled 2024-02-13 (×5): qty 1

## 2024-02-13 MED ORDER — IOHEXOL 350 MG/ML SOLN
100.0000 mL | Freq: Once | INTRAVENOUS | Status: AC | PRN
  Administered 2024-02-13: 100 mL via INTRAVENOUS

## 2024-02-13 MED ORDER — CHLORDIAZEPOXIDE HCL 25 MG PO CAPS: 25.0000 mg | ORAL_CAPSULE | Freq: Every day | ORAL | Status: DC

## 2024-02-13 MED ORDER — ENOXAPARIN SODIUM 40 MG/0.4ML IJ SOSY
40.0000 mg | PREFILLED_SYRINGE | INTRAMUSCULAR | Status: DC
  Administered 2024-02-14 – 2024-02-16 (×3): 40 mg via SUBCUTANEOUS
  Filled 2024-02-13 (×4): qty 0.4

## 2024-02-13 MED ORDER — LOPERAMIDE HCL 2 MG PO CAPS: 2.0000 mg | ORAL_CAPSULE | ORAL | Status: AC | PRN

## 2024-02-13 MED ORDER — THIAMINE HCL 100 MG/ML IJ SOLN
100.0000 mg | Freq: Every day | INTRAMUSCULAR | Status: DC
  Administered 2024-02-13: 100 mg via INTRAVENOUS
  Filled 2024-02-13: qty 2

## 2024-02-13 NOTE — ED Triage Notes (Signed)
 Pt c/o vomiting, generalized weakness, and lightheadedness x today, pt does endorse smoking 2 packs of cigarettes/day and ETOH use daily. Denies CP and shob at this time

## 2024-02-13 NOTE — ED Provider Notes (Signed)
 [place] EMERGENCY DEPARTMENT AT [facility] Provider Note   CSN: [id number] Arrival date & time: 02/13/24  0715     History  Chief Complaint  Patient presents with   Emesis    Maxwell Rosales is a 53 y.o. male with PMH as listed below who presents with his wife who provides additional history. Patient presents w/ multiple complaints.  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  Timeframe is months to years.  Wife states that his symptoms are getting worse and she cannot get him into a doctor. Patient states he has not seen a doctor since the early 90s.  He states that he is trembling so frequently now that it is difficult for him to write a check for example. His most concerning and bothersome symptom is his neuropathy, which is difficult to describe pain in his bilateral lower extremities that occurs all the time, not only with ambulation.  He also reports difficulty with balance sometimes.  He has had no swelling, redness, or trauma to the legs that he can remember.  Wife associates the onset of symptoms with use of Cialis in the past, which he did not use again.  No specific joint pain or calf pain, just pain in the whole leg.  Patient endorses visual changes including spots in his vision and states that it is difficult for him to read sometimes.  He denies any double vision.  This also been going on for months.  Denies any pain to the eyes, headaches, asymmetric numbness/tingling or weakness, slurred speech, facial droop, confusion, loss of consciousness, seizure.  Patient also endorses shortness of breath especially with activity and gets very easily winded.  Patient denies chest pain, abdominal pain, urinary symptoms, diarrhea or constipation, hematochezia/melena. Patient states he drinks 1/5 of liquor every night before bed and smokes 2  packs/day x 50 years.  He does not take any medicines and has no diagnosed medical problems.  Patient drives truck for living.   Past Medical History:  Diagnosis Date   Shoulder dislocation        Home Medications Prior to Admission medications   Medication Sig Start Date End Date Taking? Authorizing Provider  aspirin  EC 81 MG tablet Take 81 mg by mouth daily. Swallow whole.   Yes [provider]  ibuprofen  (ADVIL ) 200 MG tablet Take 800 mg by mouth every 6 (six) hours as needed for mild pain (pain score 1-3).   Yes [provider]  Misc Natural Products (NEURIVA PO) Take 1 capsule by mouth daily. For nerve pain   Yes [provider]      Allergies    Patient has no known allergies.    Review of Systems   Review of Systems A 10 point review of systems was performed and is negative unless otherwise reported in HPI.  Physical Exam Updated Vital Signs BP 119/72   Pulse 76   Temp 98.6 F (37 C) (Oral)   Resp 19   SpO2 93%  Physical Exam General: Normal appearing male, lying in bed.  HEENT: NCAT, EOMI, PERRLA, Sclera anicteric, MMM, trachea midline. Clear oropharynx. No tongue fasciculations. Cardiology: Regular tachycardic rate, no murmurs/rubs/gallops. BL radial and DP pulses equal bilaterally.  Resp: Normal respiratory rate and effort. CTAB, no wheezes, rhonchi, crackles.  Abd: Soft, non-tender, non-distended. No rebound tenderness or guarding.  GU: Deferred. MSK: 1+ pitting bilateral lower  extremity edema, symmetric, up to mid calf. No signs of trauma. Extremities without deformity or TTP. No cyanosis or clubbing. Skin: warm, dry.  Back: No CVA tenderness Neuro: A&Ox4, CNs II-XII grossly intact. 5/5 strength all extremities. Sensation grossly intact. Normal speech. Normal gait. No tremor noted on exam.  Psych: Normal mood and affect.   ED Results / Procedures / Treatments   Labs (all labs ordered are listed, but only abnormal results are  displayed) Labs Reviewed  CBC WITH DIFFERENTIAL/PLATELET - Abnormal; Notable for the following components:      Result Value   RBC 3.91 (*)    MCV 106.9 (*)    MCH 38.6 (*)    MCHC 36.1 (*)    Platelets 106 (*)    All other components within normal limits  COMPREHENSIVE METABOLIC PANEL WITH GFR - Abnormal; Notable for the following components:   Potassium 3.3 (*)    Creatinine, Ser 0.59 (*)    Calcium 8.4 (*)    Total Protein 6.4 (*)    Albumin 3.2 (*)    AST 171 (*)    ALT 108 (*)    Alkaline Phosphatase 128 (*)    Total Bilirubin 3.1 (*)    Anion gap 19 (*)    All other components within normal limits  HEMOGLOBIN A1C - Abnormal; Notable for the following components:   Hgb A1c MFr Bld 4.7 (*)    All other components within normal limits  TSH - Abnormal; Notable for the following components:   TSH 6.117 (*)    All other components within normal limits  BRAIN NATRIURETIC PEPTIDE - Abnormal; Notable for the following components:   B Natriuretic Peptide 308.0 (*)    All other components within normal limits  ETHANOL - Abnormal; Notable for the following components:   Alcohol, Ethyl (B) 205 (*)    All other components within normal limits  TROPONIN I (HIGH SENSITIVITY) - Abnormal; Notable for the following components:   Troponin I (High Sensitivity) 44 (*)    All other components within normal limits  TROPONIN I (HIGH SENSITIVITY) - Abnormal; Notable for the following components:   Troponin I (High Sensitivity) 43 (*)    All other components within normal limits  LIPASE, BLOOD  PROTIME-INR  VITAMIN B12  T4, FREE  RAPID HIV SCREEN (HIV 1/2 AB+AG)  URINALYSIS, ROUTINE W REFLEX MICROSCOPIC  VITAMIN B1  RPR  RAPID URINE DRUG SCREEN, HOSP PERFORMED  CBG MONITORING, ED    EKG EKG Interpretation Date/Time:  Tuesday February 13 2024 07:37:44 EDT Ventricular Rate:  117 PR Interval:  132 QRS Duration:  122 QT Interval:  358 QTC Calculation: 500 R Axis:   54  Text  Interpretation: Sinus tachycardia Left bundle branch block No prior for comparison Confirmed by Franklyn Gills [phone]) on 02/13/2024 7:44:21 AM  Radiology US  ARTERIAL ABI (SCREENING LOWER EXTREMITY) Result Date: 02/13/2024 CLINICAL DATA:  53 year old male with bilateral lower extremity pain and weakness EXAM: NONINVASIVE PHYSIOLOGIC VASCULAR STUDY OF BILATERAL LOWER EXTREMITIES TECHNIQUE: Evaluation of both lower extremities were performed at rest, including calculation of ankle-brachial indices with single level Doppler, pressure and pulse volume recording. COMPARISON:  CT scan of the abdomen and pelvis 02/13/2024 FINDINGS: Right ABI:  0.79 Left ABI:  0.75 Right Lower Extremity: Abnormal monophasic arterial waveform at the ankle. Left Lower Extremity: Abnormal monophasic arterial waveform at the ankle. 0.5-0.79 Moderate PAD IMPRESSION: 1. Symmetrically abnormal bilateral ankle-brachial indices consistent with at least moderate underlying peripheral arterial disease. 2. 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 Electronically Signed   By: Wilkie Lent M.D.   On: 02/13/2024 11:08   CT Angio Chest PE W and/or Wo Contrast Result Date: 02/13/2024 EXAM: CTA CHEST AORTA 02/13/2024 09:10:22 AM TECHNIQUE: CTA of the chest was performed after the administration of intravenous contrast. Multiplanar reformatted images are provided for review. MIP images are provided for review. Automated exposure control, iterative reconstruction, and/or weight based adjustment of the mA/kV was utilized to reduce the radiation dose to as low as reasonably achievable. COMPARISON: None available. CLINICAL HISTORY: Pulmonary embolism (PE) suspected, high prob. Pt c/o vomiting, generalized weakness, and lightheadedness x today, pt does endorse smoking 2 packs of cigarettes/day and ETOH use daily. Denies CP and shob at this time. FINDINGS: AORTA: No thoracic aortic dissection. No aneurysm. There is mild  calcific atheromatous disease within the aortic arch. MEDIASTINUM: No mediastinal lymphadenopathy. The heart is mildly enlarged. There is moderate calcific coronary artery disease present. LYMPH NODES: No mediastinal, hilar or axillary lymphadenopathy. LUNGS AND PLEURA: There are peripheral pulmonary blebs present. There are ground-glass opacities present dependently within the lower lobes bilaterally. No focal consolidation or pulmonary edema. No pleural effusion or pneumothorax. UPPER ABDOMEN: Limited images of the upper abdomen are unremarkable. SOFT TISSUES AND BONES: No acute bone or soft tissue abnormality. IMPRESSION: 1. No evidence of pulmonary embolism. 2. Mildly enlarged heart with moderate calcific coronary artery disease. 3. Mild calcific atheromatous disease within the aortic arch. Electronically signed by: Evalene Coho MD 02/13/2024 09:43 AM EDT RP Workstation: [id number]   CT ABDOMEN PELVIS W CONTRAST Result Date: 02/13/2024 EXAM: CT ABDOMEN AND PELVIS WITH CONTRAST 02/13/2024 09:10:22 AM TECHNIQUE: CT of the abdomen and pelvis was performed with the administration of intravenous contrast. Multiplanar reformatted images are provided for review. Automated exposure control, iterative reconstruction, and/or weight based adjustment of the mA/kV was utilized to reduce the radiation dose to as low as reasonably achievable. COMPARISON: None available. CLINICAL HISTORY: Nausea/vomiting, elevated LFTs. Pt c/o vomiting, generalized weakness, and lightheadedness x today, pt does endorse smoking 2 packs of cigarettes/day and ETOH use daily. Denies CP and shob at this time. FINDINGS: LOWER CHEST: No acute abnormality. LIVER: There is marked fatty infiltration of the liver. GALLBLADDER AND BILE DUCTS: There is radiopaque debris layering dependently within the gallbladder. No biliary ductal dilatation. SPLEEN: No acute abnormality. PANCREAS: No acute abnormality. ADRENAL GLANDS: No acute abnormality. KIDNEYS,  URETERS AND BLADDER: No stones in the kidneys or ureters. No hydronephrosis. No perinephric or periureteral stranding. Urinary bladder is unremarkable. GI AND BOWEL: There are numerous scattered colonic diverticula present. Stomach demonstrates no acute abnormality. There is no bowel obstruction. No bowel wall thickening. PERITONEUM AND RETROPERITONEUM: No ascites. No free air. VASCULATURE: There is mild fusiform aneurysmal dilatation of the abdominal aorta which measures up to 3.4 x 2.9 cm in cross-section. There is extensive calcific and noncalcific plaque present within the abdominal aorta. There is near occlusive stenosis of the aortic bifurcation and origins of the internal iliac arteries bilaterally. LYMPH NODES: No lymphadenopathy. REPRODUCTIVE ORGANS: No acute abnormality. BONES AND SOFT TISSUES: No acute osseous abnormality. No focal soft tissue abnormality. IMPRESSION: 1. 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 2. 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 3. Marked hepatic steatosis. 4. Radiopaque debris layering dependently within the gallbladder. Electronically signed by: Evalene Coho  MD 02/13/2024 09:41 AM EDT RP Workstation: [id number]   CT Head Wo Contrast Result Date: 02/13/2024 EXAM: CT HEAD WITHOUT CONTRAST 02/13/2024 09:10:22 AM TECHNIQUE: CT of the head was performed without the administration of intravenous contrast. Automated exposure control, iterative reconstruction, and/or weight based adjustment of the mA/kV was utilized to reduce the radiation dose to as low as reasonably achievable. COMPARISON: None available. CLINICAL HISTORY: Visual changes, balance issues. Patient complains of vomiting, generalized weakness, and lightheadedness today. Patient endorses smoking 2 packs of cigarettes per day and alcohol use  daily. Denies chest pain and shortness of breath at this time. FINDINGS: BRAIN AND VENTRICLES: No acute hemorrhage. Gray-white differentiation is preserved. No hydrocephalus. No extra-axial collection. No mass effect or midline shift. ORBITS: No acute abnormality. SINUSES: No acute abnormality. SOFT TISSUES AND SKULL: No acute soft tissue abnormality. No skull fracture. VASCULATURE: There are calcifications within the carotid siphons. IMPRESSION: 1. No acute intracranial abnormality. 2. Calcifications within the carotid siphons. Electronically signed by: Evalene Coho MD 02/13/2024 09:25 AM EDT RP Workstation: [id number]   DG Chest Portable 1 View Result Date: 02/13/2024 CLINICAL DATA:  SOB EXAM: DG CHEST 1V PORT COMPARISON:  None available. FINDINGS: No focal airspace consolidation, pleural effusion, or pneumothorax. No cardiomegaly. No acute fracture or destructive lesions. Multilevel thoracic osteophytosis. IMPRESSION: No acute cardiopulmonary abnormality. Electronically Signed   By: Rogelia Myers M.D.   On: 02/13/2024 08:24    Procedures .Critical Care  Performed by: Franklyn Sid SAILOR, MD Authorized by: Franklyn Sid SAILOR, MD   Critical care provider statement:    Critical care time (minutes):  45   Critical care was necessary to treat or prevent imminent or life-threatening deterioration of the following conditions:  Toxidrome   Critical care was time spent personally by me on the following activities:  Development of treatment plan with patient or surrogate, evaluation of patient's response to treatment, examination of patient, ordering and review of laboratory studies, ordering and review of radiographic studies, ordering and performing treatments and interventions, pulse oximetry, re-evaluation of patient's condition, review of old charts and obtaining history from patient or surrogate   Care discussed with: admitting provider       Medications Ordered in ED Medications  ondansetron   (ZOFRAN ) injection 4 mg (4 mg Intravenous Patient Refused/Not Given 02/13/24 1018)  nicotine  (NICODERM CQ  - dosed in mg/24 hours) patch 21 mg (21 mg Transdermal Patch Applied 02/13/24 1332)  LORazepam  (ATIVAN ) injection 0-4 mg ( Intravenous See Alternative 02/13/24 1352)    Or  LORazepam  (ATIVAN ) tablet 0-4 mg (1 mg Oral Given 02/13/24 1352)  LORazepam  (ATIVAN ) injection 0-4 mg (has no administration in time range)    Or  LORazepam  (ATIVAN ) tablet 0-4 mg (has no administration in time range)  thiamine  (VITAMIN B1) tablet 100 mg (has no administration in time range)    Or  thiamine  (VITAMIN B1) injection 100 mg (has no administration in time range)  acetaminophen  (TYLENOL ) tablet 650 mg (has no administration in time range)  iohexol  (OMNIPAQUE ) 350 MG/ML injection 100 mL (100 mLs Intravenous Contrast Given 02/13/24 0852)  lactated ringers  bolus 1,000 mL (0 mLs Intravenous Stopped 02/13/24 1333)    ED Course/ Medical Decision Making/ A&P                          Medical Decision Making Amount and/or Complexity of Data Reviewed Labs: ordered. Decision-making details documented in ED Course. Radiology: ordered. Decision-making details documented in ED Course.  Risk OTC drugs. Prescription drug management. Decision regarding hospitalization.    This patient presents to the ED for concern of multiple complaints, this involves an extensive number of treatment options, and is a complaint that carries with it a high risk of complications and morbidity.  I considered the following differential and admission for this acute, potentially life threatening condition.   MDM:    Patient with multiple complaints and has not seen a doctor in greater than 30 years.  For his reported neuropathy, he does have bilateral symptoms that he does not strictly report as burning or hot/cold.  They do not only occur when he walks, lower concern for claudication.  He has pulses in bilateral lower extremities but  they do feel weak and patient reports that sometimes his legs go numb, will obtain bilateral lower extremity ABIs to assess for peripheral arterial disease. Also consider aortic syndromes such as aortic dissection or aneurysm. 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  Considered other causes of peripheral neuropathy including vitamin B1 or B12 deficiency, HIV or syphilis, thyroid disease.  Ultimately patient's CT abdomen pelvis demonstrates near occlusive stenosis of the aortic bifurcation which is likely the cause of his symptoms.  His bilateral lower extremity ABIs are mild to moderate peripheral artery disease and patient does have pulses in his feet.  Will need close outpatient follow-up with vascular surgery and discussed this with vascular PA Schuh.   Patient reports changes to his vision and difficulty with balance sometimes as well.  CT head negative for ICH or NPH. No focal neuro deficits on exam to indicte acute stroke. Paitent with daily tremors and nausea vomiting that seem to increase in the afternoon and decreased when he begins to drink.  I believe the patient is likely experiencing alcohol use disorder with some component of daily alcohol withdrawal that could be contributing to his symptoms.  His elevated LFTs are in an alcoholic pattern.  He does report some spots in his vision, this could be a perceptual disturbance as a result of alcohol withdrawal.  Will obtain a CIWA and initiate treatment with lorazepam .  Patient likely also with hypertension though difficult to tell in the setting of acute alcohol withdrawal.  He also appears to have carotid calcifications and thrombocytopenia/liver disease.  Discussed with the patient and his wife the importance of establishing with a primary care physician.  Discussed with them extensively the importance of following up with vascular surgery for his severe abdominal  aortic stenosis and aneurysm.  Patient reports understanding and agrees to be admitted to the hospital for alcohol withdrawal and telemetry monitoring.  Clinical Course as of 02/13/24 1523  Tue Feb 13, 2024  0744 Glucose-Capillary: 84 [HN]  0817 MCV(!): 106.9 macrocytosis [HN]  0817 Platelets(!): 106 thrombocytopenia [HN]  0818 Hemoglobin: 15.1 No anemia [HN]  0828 Alkaline Phosphatase(!): 128 [HN]  0828 ALT(!): 108 [HN]  0828 AST(!): 171 Elevated LFTs and T. bili [HN]  0828 Lipase: 30 Negative [HN]  0952 Alcohol, Ethyl (B)(!): 205 [HN]  0952 CT Angio Chest PE W and/or Wo Contrast 1. No evidence of pulmonary embolism. 2. Mildly enlarged heart with moderate calcific coronary artery disease. 3. Mild calcific atheromatous disease within the aortic arch.   [HN]  [id number] CT ABDOMEN PELVIS W CONTRAST 1. 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 2. Mild fusiform aneurysmal dilatation of the abdominal aorta measuring  up to 3.4 x 2.9 cm in cross-section, with extensive calcific and noncalcific plaque. 3. Marked hepatic steatosis. 4. Radiopaque debris layering dependently within the gallbladder.   [HN]  0952 CT Head Wo Contrast 1. No acute intracranial abnormality. 2. Calcifications within the carotid siphons.   [HN]  0953 Vitamin B12: 582 [HN]  0955 Consulted to vascular surgery [HN]  1011 D/w Ahmed Holster PA w/ vascular surgery who states that as long as patient has pulses in his feet, he does not need acute intervention from a vascular standpoint and can do short-interval f/u in clinic for further intervention on outpatient basis.  [HN]  1020 INR: 0.9 wnl [HN]  1136 US  ARTERIAL ABI (SCREENING LOWER EXTREMITY) 1. Symmetrically abnormal bilateral ankle-brachial indices consistent with at least moderate underlying peripheral arterial disease. 2. Correlation with prior CT imaging confirms aortoiliac  occlusive disease.   [HN]  1136 Patient with run of NSVT on telemetry, 9 beats, returned to mild sinus tachycardia [HN]  1252 Initially patient's heart rate did decrease with fluids.  Now in the afternoon, reevaluated patient to give him his results.  On reevaluation his tongue is fasciculating, tremors even at rest.  On further discussion with him and his wife it seems that the tremors typically do start in the afternoon and subside after he begins drinking alcohol.  I suspect that patient is experiencing daily alcohol withdrawal that is controlled when he begins to drink at night. [HN]    Clinical Course User Index [HN] Franklyn Sid SAILOR, MD    Labs: I Ordered, and personally interpreted labs.  The pertinent results include:  those listed above  Imaging Studies ordered: I ordered imaging studies including CXR I independently visualized and interpreted imaging. I agree with the radiologist interpretation  Additional history obtained from chart review, wife at bedside.    Cardiac Monitoring: The patient was maintained on a cardiac monitor.  I personally viewed and interpreted the cardiac monitored which showed an underlying rhythm of: Sinus tachycardia with left bundle branch block  Reevaluation: After the interventions noted above, I reevaluated the patient and found that they have :worsened  Social Determinants of Health: Lives independently  Disposition:  Admit to hospitalist Dr. Bryn  Co morbidities that complicate the patient evaluation  Past Medical History:  Diagnosis Date   Shoulder dislocation      Medicines Meds ordered this encounter  Medications   ondansetron  (ZOFRAN ) injection 4 mg   iohexol  (OMNIPAQUE ) 350 MG/ML injection 100 mL   lactated ringers  bolus 1,000 mL   nicotine  (NICODERM CQ  - dosed in mg/24 hours) patch 21 mg   OR Linked Order Group    LORazepam  (ATIVAN ) injection 0-4 mg     CIWA-AR < 5 =:   0 mg     CIWA-AR 5 -10 =:   1 mg     CIWA-AR 11 -15  =:   2 mg     CIWA-AR 16 -20 =:   3 mg     CIWA-AR 16 -20 =:   Recheck CIWA-AR in 1 hour; if > 20 notify MD     CIWA-AR > 20 =:   4 mg     CIWA-AR > 20 =:   Notify MD    LORazepam  (ATIVAN ) tablet 0-4 mg     CIWA-AR < 5 =:   0 mg     CIWA-AR 5 -10 =:   1 mg     CIWA-AR 11 -15 =:   2 mg  CIWA-AR 16 -20 =:   3 mg     CIWA-AR 16 -20 =:   Recheck CIWA-AR in 1 hour; if > 20 notify MD     CIWA-AR > 20 =:   4 mg     CIWA-AR > 20 =:   Notify MD   OR Linked Order Group    LORazepam  (ATIVAN ) injection 0-4 mg     CIWA-AR < 5 =:   0 mg     CIWA-AR 5 -10 =:   1 mg     CIWA-AR 11 -15 =:   2 mg     CIWA-AR 16 -20 =:   3 mg     CIWA-AR 16 -20 =:   Recheck CIWA-AR in 1 hour; if > 20 notify MD     CIWA-AR > 20 =:   4 mg     CIWA-AR > 20 =:   Notify MD    LORazepam  (ATIVAN ) tablet 0-4 mg     CIWA-AR < 5 =:   0 mg     CIWA-AR 5 -10 =:   1 mg     CIWA-AR 11 -15 =:   2 mg     CIWA-AR 16 -20 =:   3 mg     CIWA-AR 16 -20 =:   Recheck CIWA-AR in 1 hour; if > 20 notify MD     CIWA-AR > 20 =:   4 mg     CIWA-AR > 20 =:   Notify MD   OR Linked Order Group    thiamine  (VITAMIN B1) tablet 100 mg    thiamine  (VITAMIN B1) injection 100 mg   acetaminophen  (TYLENOL ) tablet 650 mg    I have reviewed the patients home medicines and have made adjustments as needed  Problem List / ED Course: Problem List Items Addressed This Visit       Other   * (Principal) Alcohol withdrawal (HCC) - Primary   Other Visit Diagnoses       Abdominal aortic stenosis       Relevant Medications   aspirin  EC 81 MG tablet     Alcohol use disorder         Nausea and vomiting, unspecified vomiting type         Infrarenal abdominal aortic aneurysm (AAA) without rupture (HCC)       Relevant Medications   aspirin  EC 81 MG tablet     Hepatic steatosis         Elevated LFTs         Bilateral leg pain         NSVT (nonsustained ventricular tachycardia) (HCC)       Relevant Medications   aspirin  EC 81 MG tablet                    This note was created using dictation software, which may contain spelling or grammatical errors.    Franklyn Sid SAILOR, MD 02/13/24 [phone]

## 2024-02-13 NOTE — H&P (Signed)
 History and Physical    Patient: Maxwell Rosales FMW:[id number] DOB: Oct 10, 1970 DOA: 02/13/2024 DOS: the patient was seen and examined on 02/13/2024 PCP: Pcp, No  Patient coming from: Home  Chief Complaint:  Chief Complaint  Patient presents with   Emesis   HPI: TREYLAN Rosales is a 53 y.o. male with a history of alcohol abuse, tobacco use, and no medical follow up who presented to the ED this morning with tremulousness, chronic lower extremity numbness and pain, weakness, and lightheadedness. He reports drinking less than a fifth of whiskey daily of late, has slowed down and stopped before without withdrawal seizures. Never been admitted for withdrawal. Last drink was the evening prior to arrival, EtOH level still elevated at 205 in the ED. He is aware that he needs to stop drinking and is amenable to plan for cessation and detox as inpatient.   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 No change of late, but it is a severe symptom for him.   In the ED he was tachycardic and tremulous. Work up in the ED revealed moderate occlusive proximal LE PAD, elevated LFTs, thrombocytopenia. Admission requested for alcohol withdrawal.   Review of Systems: As mentioned in the history of present illness. All other systems reviewed and are negative. Past Medical History:  Diagnosis Date   Shoulder dislocation    Past Surgical History:  Procedure Laterality Date   APPENDECTOMY     Social History:  reports that he has been smoking. He does not have any smokeless tobacco history on file. He reports current alcohol use. He reports that he does not use drugs.  No Known Allergies  History reviewed. No pertinent family history.  Prior to Admission medications   Medication Sig Start Date End Date Taking? Authorizing Provider  aspirin  EC 81 MG tablet Take 81 mg by mouth daily. Swallow whole.   Yes  [provider]  ibuprofen  (ADVIL ) 200 MG tablet Take 800 mg by mouth every 6 (six) hours as needed for mild pain (pain score 1-3).   Yes [provider]  Misc Natural Products (NEURIVA PO) Take 1 capsule by mouth daily. For nerve pain   Yes [provider]    Physical Exam: Vitals:   02/13/24 1334 02/13/24 1415 02/13/24 1430 02/13/24 1535  BP: (!) 164/106 132/68 119/72 (!) 164/106  Pulse: 85 84 76 96  Resp:  20 19 20   Temp:    98.5 F (36.9 C)  TempSrc:    Oral  SpO2:  91% 93% 96%  Weight:    80.3 kg  Height:    5' 10.5 (1.791 m)  Gen: Tremulous male in no acute distress Pulm: Clear, nonlabored, diminished without wheezing  CV: Regular tachycardia, no MRG or edema GI: Soft, NT, ND, +BS Neuro: Alert and oriented. No asterixis. No new focal deficits. Ext: Warm, no deformities. Skin: No rashes, lesions or ulcers on visualized skin   Data Reviewed: Na 140, K 3.3, Cr 0.59, BUN 7, AG 19 Albumin 3.2, INR 0.9 AST 171, ALT 1`08, TBili 3.1, alk phos 128.  BNP 308, Tn 44 > 43 ECG: ST w/LBBB Hgb 15.1g/dl, macrocytic Plt 893x Vitamin B12 582. TSH 6.117, free T4 0.97.  HIV NR EtOH 205mg /dl CT head: No acute abnormality CTA chest: No PE, coronary calcifications, aortic arch calcific atheromatous change.  CT abd/pelvis: Near occlusive stenosis of the aortic bifurcation and origins of  the internal iliac arteries bilaterally. 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 Marked hepatic steatosis. Radiopaque debris layering dependently within the gallbladder.  Assessment and Plan: Alcohol abuse and withdrawal:  - Add librium  taper, continue CIWA on telemetry floor. Given timing of last drink and current symptoms, would have low threshold to move to SDU, though in his favor is the fact that he reports stopping in the past without severe symptoms.  - TOC  consult - Thiamine , MVM  Alcoholic hepatitis and ketoacidosis: Discriminant function not severely elevated with normal INR.  - Trend LFTs   Tobacco use:  - 21mg  nicotine  patch - Pt is precontemplative, though the paramount nature of cessation was discussed at length. Needs PCP follow up.   Coronary calcification, LBBB, troponin elevation due to demand ischemia: No chest pain.  - Will need cardiology referral for ischemic risk stratification.  - Empirically start aspirin  81mg , metoprolol  with elevated HR and BP, and check lipid panel to guide statin selection  PAD:  - Vascular surgery referral. No limb threatening ischemia at this time.  - Start ASA, statin as above - Tobacco cessation as above  Hypokalemia:  - Supplement   Advance Care Planning: Full code  Consults: None  Family Communication: Spouse at bedside  Severity of Illness: The appropriate patient status for this patient is OBSERVATION. Observation status is judged to be reasonable and necessary in order to provide the required intensity of service to ensure the patient's safety. The patient's presenting symptoms, physical exam findings, and initial radiographic and laboratory data in the context of their medical condition is felt to place them at decreased risk for further clinical deterioration. Furthermore, it is anticipated that the patient will be medically stable for discharge from the hospital within 2 midnights of admission.   Author: Bernardino KATHEE Come, MD 02/13/2024 5:35 PM  For on call review [email or web address].

## 2024-02-14 DIAGNOSIS — I350 Nonrheumatic aortic (valve) stenosis: Secondary | ICD-10-CM | POA: Diagnosis present

## 2024-02-14 DIAGNOSIS — Z716 Tobacco abuse counseling: Secondary | ICD-10-CM | POA: Diagnosis not present

## 2024-02-14 DIAGNOSIS — I7143 Infrarenal abdominal aortic aneurysm, without rupture: Secondary | ICD-10-CM | POA: Diagnosis present

## 2024-02-14 DIAGNOSIS — I2510 Atherosclerotic heart disease of native coronary artery without angina pectoris: Secondary | ICD-10-CM | POA: Diagnosis present

## 2024-02-14 DIAGNOSIS — I739 Peripheral vascular disease, unspecified: Secondary | ICD-10-CM | POA: Diagnosis present

## 2024-02-14 DIAGNOSIS — K7010 Alcoholic hepatitis without ascites: Secondary | ICD-10-CM | POA: Diagnosis present

## 2024-02-14 DIAGNOSIS — F10930 Alcohol use, unspecified with withdrawal, uncomplicated: Secondary | ICD-10-CM | POA: Diagnosis not present

## 2024-02-14 DIAGNOSIS — Z720 Tobacco use: Secondary | ICD-10-CM | POA: Diagnosis not present

## 2024-02-14 DIAGNOSIS — E876 Hypokalemia: Secondary | ICD-10-CM | POA: Diagnosis present

## 2024-02-14 DIAGNOSIS — I10 Essential (primary) hypertension: Secondary | ICD-10-CM | POA: Diagnosis not present

## 2024-02-14 DIAGNOSIS — F10931 Alcohol use, unspecified with withdrawal delirium: Secondary | ICD-10-CM | POA: Diagnosis not present

## 2024-02-14 DIAGNOSIS — I7409 Other arterial embolism and thrombosis of abdominal aorta: Secondary | ICD-10-CM | POA: Diagnosis not present

## 2024-02-14 DIAGNOSIS — I472 Ventricular tachycardia, unspecified: Secondary | ICD-10-CM | POA: Diagnosis present

## 2024-02-14 DIAGNOSIS — I2489 Other forms of acute ischemic heart disease: Secondary | ICD-10-CM | POA: Diagnosis present

## 2024-02-14 DIAGNOSIS — Z7982 Long term (current) use of aspirin: Secondary | ICD-10-CM | POA: Diagnosis not present

## 2024-02-14 DIAGNOSIS — F17200 Nicotine dependence, unspecified, uncomplicated: Secondary | ICD-10-CM | POA: Diagnosis present

## 2024-02-14 DIAGNOSIS — D696 Thrombocytopenia, unspecified: Secondary | ICD-10-CM | POA: Diagnosis not present

## 2024-02-14 DIAGNOSIS — I493 Ventricular premature depolarization: Secondary | ICD-10-CM | POA: Diagnosis present

## 2024-02-14 DIAGNOSIS — K760 Fatty (change of) liver, not elsewhere classified: Secondary | ICD-10-CM | POA: Diagnosis present

## 2024-02-14 DIAGNOSIS — E8729 Other acidosis: Secondary | ICD-10-CM | POA: Diagnosis present

## 2024-02-14 DIAGNOSIS — F10139 Alcohol abuse with withdrawal, unspecified: Secondary | ICD-10-CM | POA: Diagnosis present

## 2024-02-14 DIAGNOSIS — I447 Left bundle-branch block, unspecified: Secondary | ICD-10-CM | POA: Diagnosis present

## 2024-02-14 DIAGNOSIS — G629 Polyneuropathy, unspecified: Secondary | ICD-10-CM | POA: Diagnosis present

## 2024-02-14 DIAGNOSIS — F10939 Alcohol use, unspecified with withdrawal, unspecified: Secondary | ICD-10-CM | POA: Diagnosis present

## 2024-02-14 LAB — LIPID PANEL
Cholesterol: 215 mg/dL — ABNORMAL HIGH (ref 0–200)
HDL: 76 mg/dL (ref >40)
LDL Cholesterol: 114 mg/dL — ABNORMAL HIGH (ref 0–99)
Total CHOL/HDL Ratio: 2.8 ratio
Triglycerides: 124 mg/dL (ref <150)
VLDL: 25 mg/dL (ref 0–40)

## 2024-02-14 LAB — COMPREHENSIVE METABOLIC PANEL WITH GFR
ALT: 97 U/L — ABNORMAL HIGH (ref 0–44)
AST: 183 U/L — ABNORMAL HIGH (ref 15–41)
Albumin: 2.9 g/dL — ABNORMAL LOW (ref 3.5–5.0)
Alkaline Phosphatase: 130 U/L — ABNORMAL HIGH (ref 38–126)
Anion gap: 14 (ref 5–15)
BUN: 8 mg/dL (ref 6–20)
CO2: 27 mmol/L (ref 22–32)
Calcium: 8.7 mg/dL — ABNORMAL LOW (ref 8.9–10.3)
Chloride: 97 mmol/L — ABNORMAL LOW (ref 98–111)
Creatinine, Ser: 0.64 mg/dL (ref 0.61–1.24)
GFR, Estimated: >60 mL/min (ref >60)
Glucose, Bld: 88 mg/dL (ref 70–99)
Potassium: 4.1 mmol/L (ref 3.5–5.1)
Sodium: 138 mmol/L (ref 135–145)
Total Bilirubin: 4 mg/dL — ABNORMAL HIGH (ref 0.0–1.2)
Total Protein: 5.5 g/dL — ABNORMAL LOW (ref 6.5–8.1)

## 2024-02-14 LAB — RPR: RPR Ser Ql: NONREACTIVE

## 2024-02-14 LAB — CBC
HCT: 39 % (ref 39.0–52.0)
Hemoglobin: 13.9 g/dL (ref 13.0–17.0)
MCH: 38.6 pg — ABNORMAL HIGH (ref 26.0–34.0)
MCHC: 35.6 g/dL (ref 30.0–36.0)
MCV: 108.3 fL — ABNORMAL HIGH (ref 80.0–100.0)
Platelets: 86 K/uL — ABNORMAL LOW (ref 150–400)
RBC: 3.6 MIL/uL — ABNORMAL LOW (ref 4.22–5.81)
RDW: 13.3 % (ref 11.5–15.5)
WBC: 6.2 K/uL (ref 4.0–10.5)
nRBC: 0 % (ref 0.0–0.2)

## 2024-02-14 LAB — MAGNESIUM: Magnesium: 1.2 mg/dL — ABNORMAL LOW (ref 1.7–2.4)

## 2024-02-14 MED ORDER — METOPROLOL TARTRATE 25 MG PO TABS
25.0000 mg | ORAL_TABLET | Freq: Two times a day (BID) | ORAL | Status: DC
  Administered 2024-02-14 – 2024-02-16 (×5): 25 mg via ORAL
  Filled 2024-02-14 (×5): qty 1

## 2024-02-14 MED ORDER — MAGNESIUM SULFATE 4 GM/100ML IV SOLN
4.0000 g | Freq: Once | INTRAVENOUS | Status: AC
  Administered 2024-02-14: 4 g via INTRAVENOUS
  Filled 2024-02-14: qty 100

## 2024-02-14 NOTE — Progress Notes (Signed)
 [facility] PROGRESS NOTE  Maxwell Rosales (DOB: 03/11/1971) FMW:[id number] PCP: None  Brief Narrative: Maxwell Rosales is a 53 y.o. male with a history of alcohol abuse, tobacco use, and no medical follow up who presented to the ED 8/19 with tremulousness, chronic lower extremity numbness and pain, weakness, and lightheadedness. His last drink was the night before, EtOH level 203mg /dl, and he appeared to be starting to enter alcohol withdrawal for which admission was requested. He also reported bilateral lower leg pain and work up in the ED revealed moderate occlusive proximal LE PAD, elevated LFTs, thrombocytopenia. Has had NSVT as well.   Subjective: Having episodes of agitation/restlessness and tremulousness that are becoming more frequent, though after ativan , will become drowsy and has had incontinence at times. Spouse at bedside is retired Chief Executive Officer.  Objective: BP (!) 139/96 (BP Location: Left Arm)   Pulse 98   Temp 98.7 F (37.1 C) (Oral)   Resp 18   Ht 5' 10.5 (1.791 m)   Wt 80.3 kg   SpO2 99%   BMI 25.04 kg/m   Gen: Drowsy but rousable and  Pulm: Clear, nonlabored  CV: Regular tachycardia, no MRG GI: Soft, NT, ND, +BS  Neuro: Alert and oriented. He's mumbling but without dysarthria when urged to speak louder and normally he's able to. No asterixis, has tremulousness. No new focal deficits. Ext: Warm, no deformities. Skin: No rashes, lesions or ulcers on visualized skin   Assessment & Plan: Alcohol abuse and withdrawal:  - Continue librium  taper, continue CIWA on telemetry floor, symptoms may yet worsen. Given timing of last drink and current symptoms, would have low threshold to move to SDU. - TOC consult - Thiamine , MVM   Alcoholic hepatitis and ketoacidosis: Discriminant function not severely elevated with normal INR.  - Trend LFTs    Tobacco use:  - 21mg  nicotine  patch - Pt is precontemplative, though the paramount nature of cessation was discussed at  length. Needs PCP follow up.    Coronary calcification, LBBB, troponin elevation due to demand ischemia: No chest pain.  - Will need cardiology referral for ischemic risk stratification.  - Started aspirin  81mg , metoprolol  with elevated HR and BP. - LDL is 114, HDL up at 76. Given atherosclerotic burden, would start statin once LFTs sustain improvement.   HTN:  - Started metoprolol , will increase dose.  PVCs, NSVT: In setting of severe hypomagnesemia and alcohol withdrawal. Asymptomatic.  - Continue cardiac monitoring - Gave 4g magnesium  - Increase metoprolol , if burden becomes more, start amiodarone.    PAD:  - Vascular surgery referral. No limb threatening ischemia at this time.  - Start ASA, statin as above - Tobacco cessation as above   Thrombocytopenia:  - Monitor for stability  Hypokalemia:  - Supplement as indicated, goal 4  Maxwell KATHEE Come, MD [facility] [email or web address] 02/14/2024, 3:29 PM

## 2024-02-14 NOTE — TOC Initial Note (Signed)
 Transition of Care [facility]) - Initial/Assessment Note    Patient Details  Name: Maxwell Rosales MRN: [id number] Date of Birth: 1970/09/07  [facility] of [facility]) CM/SW Contact:    Sharlyne Stabs, RN Phone Number: 02/14/2024, 2:11 PM  Clinical Narrative:         Patient admitted with alcohol withdrawal. CM at the beside to offer resources. Patient is sleeping. Wife at the bedside. She states he drinks a 5th a day. He quit 5 years ago and stayed sober for 2 years with out assistance, he did not have withdrawals at that time. 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 She states he has no idea his alcohol level was that high and would not drive with that level. She states he needs a PCP. List added to AVS. [employer] added along with other resources for her to discuss with the patient.  TOC following.     Expected Discharge Plan: Home/Self Care Barriers to Discharge: Continued Medical Work up   Patient Goals and CMS Choice Patient states their goals for this hospitalization and ongoing recovery are:: return home CMS Medicare.gov Compare Post Acute Care list provided to:: Patient Represenative (must comment) Choice offered to / list presented to : Spouse [place] ownership interest in [facility].provided to:: Spouse      Prior Living Arrangements/Services     Patient language and need for interpreter reviewed:: Yes        Need for Family Participation in Patient Care: Yes (Comment) Care giver support system in place?: Yes (comment)   Criminal Activity/Legal Involvement Pertinent to Current Situation/Hospitalization: No - Comment as needed   Permission Sought/Granted         Permission granted to share info w Relationship: wife     Emotional Assessment     Affect (typically observed): Unable to Assess Orientation: :  (sleeping) Alcohol / Substance Use: Alcohol Use Psych Involvement: No (comment)  Admission  diagnosis:  Alcohol withdrawal (HCC) [F10.939] Hepatic steatosis [K76.0] Elevated LFTs [R79.89] Bilateral leg pain [M79.604, M79.605] NSVT (nonsustained ventricular tachycardia) (HCC) [I47.29] Abdominal aortic stenosis [Q25.1] Alcohol withdrawal syndrome without complication (HCC) [F10.930] Alcohol use disorder [F10.90] Nausea and vomiting, unspecified vomiting type [R11.2] Infrarenal abdominal aortic aneurysm (AAA) without rupture [facility]) [I71.43] Patient Active Problem List   Diagnosis Date Noted   Alcohol withdrawal (HCC) 02/13/2024   Tobacco use 02/13/2024   Essential hypertension 02/13/2024   Thrombocytopenia (HCC) 02/13/2024   Hyperbilirubinemia 02/13/2024   Coronary artery calcification 02/13/2024   Demand ischemia (HCC) 02/13/2024   LBBB (left bundle branch block) 02/13/2024   Aortoiliac occlusive disease (HCC) 02/13/2024   PCP:  Freddrick No Pharmacy:   [facility] - [place], [place] - [address] [place] [address] Phone: [phone] Fax: [phone]   Social Drivers of Health (SDOH) Social History: SDOH Screenings   Food Insecurity: Patient Declined (02/14/2024)  Housing: Unknown (02/14/2024)  Transportation Needs: Patient Declined (02/14/2024)  Utilities: Not At Risk (02/14/2024)  Tobacco Use: High Risk (02/13/2024)   SDOH Interventions:    Readmission Risk Interventions     No data to display

## 2024-02-14 NOTE — Discharge Instructions (Addendum)
 1)Dr. Serene from Vascular surgery will contact you to schedule appointment with his office 2)Complete Abstinence from alcohol advised--please consider outpatient or inpatient alcohol rehab program 3)Complete Abstinence from Tobacco advised--okay to use over-the-counter nicotine  patch to help you quit smoking   Providers Accepting New Patients in [place], [place]  [facility] [address]  [place], [place] [address] [phone] Accepts most insurances  [facility] [address] [place], [place] [address] [phone] Accepts most insurances  [facility] [address] [place], [place] [address]  [phone] Must meet requirements  [facility] [address] [place], [place] [address] [phone] Accepts most insurances  [facility] [address]  [place], [place] [address] [phone] Accepts most insurances  [facility] [address]   [place], [place]   [phone] Accepts most insurances  [facility])  [id number]  [place], [place] [address] [phone] Accepts most insurances     [facility] [address]  [place], [place] [address] [phone] Accepts most insurances  [facility] [address] [place], [place] [address] [phone] option 1 Accepts Medicaid and [facility] [address]  [place], [place] [address] [phone] Accepts most insurances  Benita Outhouse, MD [address] [place], [place] [address] [phone] Accepts most insurances  [facility] at [facility] [address]  [place], [place] [address] [phone] Accepts most insurances  [facility] [phone] [address] [place], [place] [address] [phone] Accepts most insurances  [place] [address], [address] [place], [place]  [address] [phone]  Accepts most insurances

## 2024-02-15 DIAGNOSIS — F10930 Alcohol use, unspecified with withdrawal, uncomplicated: Secondary | ICD-10-CM | POA: Diagnosis not present

## 2024-02-15 LAB — VITAMIN B1: Vitamin B1 (Thiamine): 211.1 nmol/L — ABNORMAL HIGH (ref 66.5–200.0)

## 2024-02-15 LAB — COMPREHENSIVE METABOLIC PANEL WITH GFR
ALT: 85 U/L — ABNORMAL HIGH (ref 0–44)
AST: 132 U/L — ABNORMAL HIGH (ref 15–41)
Albumin: 2.5 g/dL — ABNORMAL LOW (ref 3.5–5.0)
Alkaline Phosphatase: 125 U/L (ref 38–126)
Anion gap: 11 (ref 5–15)
BUN: 10 mg/dL (ref 6–20)
CO2: 26 mmol/L (ref 22–32)
Calcium: 7.9 mg/dL — ABNORMAL LOW (ref 8.9–10.3)
Chloride: 98 mmol/L (ref 98–111)
Creatinine, Ser: 0.57 mg/dL — ABNORMAL LOW (ref 0.61–1.24)
GFR, Estimated: >60 mL/min (ref >60)
Glucose, Bld: 93 mg/dL (ref 70–99)
Potassium: 3.5 mmol/L (ref 3.5–5.1)
Sodium: 135 mmol/L (ref 135–145)
Total Bilirubin: 2.7 mg/dL — ABNORMAL HIGH (ref 0.0–1.2)
Total Protein: 5.4 g/dL — ABNORMAL LOW (ref 6.5–8.1)

## 2024-02-15 LAB — CBC
HCT: 41.6 % (ref 39.0–52.0)
Hemoglobin: 14.7 g/dL (ref 13.0–17.0)
MCH: 38.7 pg — ABNORMAL HIGH (ref 26.0–34.0)
MCHC: 35.3 g/dL (ref 30.0–36.0)
MCV: 109.5 fL — ABNORMAL HIGH (ref 80.0–100.0)
Platelets: 76 K/uL — ABNORMAL LOW (ref 150–400)
RBC: 3.8 MIL/uL — ABNORMAL LOW (ref 4.22–5.81)
RDW: 13.1 % (ref 11.5–15.5)
WBC: 5.4 K/uL (ref 4.0–10.5)
nRBC: 0 % (ref 0.0–0.2)

## 2024-02-15 LAB — MAGNESIUM: Magnesium: 1.5 mg/dL — ABNORMAL LOW (ref 1.7–2.4)

## 2024-02-15 MED ORDER — CHLORDIAZEPOXIDE HCL 5 MG PO CAPS
10.0000 mg | ORAL_CAPSULE | Freq: Three times a day (TID) | ORAL | Status: DC
  Administered 2024-02-15 (×2): 10 mg via ORAL
  Filled 2024-02-15 (×2): qty 2

## 2024-02-15 MED ORDER — MAGNESIUM SULFATE 2 GM/50ML IV SOLN
2.0000 g | Freq: Once | INTRAVENOUS | Status: AC
  Administered 2024-02-15: 2 g via INTRAVENOUS
  Filled 2024-02-15: qty 50

## 2024-02-15 MED ORDER — POTASSIUM CHLORIDE 20 MEQ PO PACK
40.0000 meq | PACK | Freq: Once | ORAL | Status: AC
  Administered 2024-02-15: 40 meq via ORAL
  Filled 2024-02-15: qty 2

## 2024-02-15 MED ORDER — POTASSIUM CHLORIDE CRYS ER 20 MEQ PO TBCR
40.0000 meq | EXTENDED_RELEASE_TABLET | Freq: Once | ORAL | Status: DC
  Filled 2024-02-15: qty 2

## 2024-02-15 MED ORDER — CHLORDIAZEPOXIDE HCL 5 MG PO CAPS: 10.0000 mg | ORAL_CAPSULE | Freq: Every day | ORAL | Status: DC

## 2024-02-15 MED ORDER — CHLORDIAZEPOXIDE HCL 5 MG PO CAPS: 10.0000 mg | ORAL_CAPSULE | ORAL | Status: DC

## 2024-02-15 NOTE — Plan of Care (Addendum)
 VS notable for tachycardia earlier in the shift. Pt has been in the high 90s to low 100s since this. PRN pain Ativan  given X1. Pt has been calm and collective overnight with some intermittent confusion and wanting to get OOB with unsteady gait but easily redirectable.     Problem: Education: Goal: Knowledge of General Education information will improve Description: Including pain rating scale, medication(s)/side effects and non-pharmacologic comfort measures Outcome: Progressing   Problem: Health Behavior/Discharge Planning: Goal: Ability to manage health-related needs will improve Outcome: Progressing   Problem: Clinical Measurements: Goal: Ability to maintain clinical measurements within normal limits will improve Outcome: Progressing Goal: Will remain free from infection Outcome: Progressing Goal: Diagnostic test results will improve Outcome: Progressing Goal: Respiratory complications will improve Outcome: Progressing Goal: Cardiovascular complication will be avoided Outcome: Progressing   Problem: Activity: Goal: Risk for activity intolerance will decrease Outcome: Progressing   Problem: Nutrition: Goal: Adequate nutrition will be maintained Outcome: Progressing   Problem: Coping: Goal: Level of anxiety will decrease Outcome: Progressing   Problem: Elimination: Goal: Will not experience complications related to bowel motility Outcome: Progressing Goal: Will not experience complications related to urinary retention Outcome: Progressing   Problem: Pain Managment: Goal: General experience of comfort will improve and/or be controlled Outcome: Progressing   Problem: Safety: Goal: Ability to remain free from injury will improve Outcome: Progressing   Problem: Skin Integrity: Goal: Risk for impaired skin integrity will decrease Outcome: Progressing

## 2024-02-15 NOTE — Progress Notes (Signed)
 [facility] PROGRESS NOTE  Maxwell Rosales (DOB: [birth year]) FMW:[id number] PCP: None  Brief Narrative: Maxwell Rosales is a 53 y.o. male with a history of alcohol abuse, tobacco use, and no medical follow up who presented to the ED 8/19 with tremulousness, chronic lower extremity numbness and pain, weakness, and lightheadedness. His last drink was the night before, EtOH level 203mg /dl, and he appeared to be starting to enter alcohol withdrawal for which admission was requested. He also reported bilateral lower leg pain and work up in the ED revealed moderate occlusive proximal LE PAD, elevated LFTs, thrombocytopenia. Has had NSVT as well.   Subjective: Had better night, still needing ativan . No palpitations, chest pain or dyspnea. Spouse at bedside is retired Chief Executive Officer.  Objective: BP 119/89 (BP Location: Left Arm)   Pulse 88   Temp 97.9 F (36.6 C) (Oral)   Resp 20   Ht 5' 10.5 (1.791 m)   Wt 80.3 kg   SpO2 92%   BMI 25.04 kg/m   Gen: No distress Pulm: Clear, diminished, nonlabored  CV: RRR, rate in 80's, no MRG GI: Soft, NT, ND, +BS  Neuro: Alert and oriented. No new focal deficits. Ext: Warm, no deformities Skin: No rashes, lesions or ulcers on visualized skin   Assessment & Plan: Alcohol abuse and withdrawal:  - Continue librium  taper, given somnolence, decrease dose to 10mg , continue CIWA on telemetry floor - TOC consult - Thiamine , MVM   Alcoholic hepatitis and ketoacidosis: Discriminant function not severely elevated with normal INR.  - Trend LFTs, improving.    Tobacco use:  - 21mg  nicotine  patch - Pt is precontemplative, though the paramount nature of cessation was discussed at length. Needs PCP follow up.    Coronary calcification, LBBB, troponin elevation due to demand ischemia: No chest pain.  - Will need cardiology referral for ischemic risk stratification.  - Started aspirin  81mg , metoprolol  with elevated HR and BP. - LDL is 114, HDL up at 76. Given  atherosclerotic burden, would start statin once LFTs sustain improvement.   HTN:  - Started metoprolol , continue  PVCs, NSVT: In setting of severe hypomagnesemia and alcohol withdrawal. Asymptomatic.  - Continue cardiac monitoring, last 24 hours had a couple episodes of NSVT 3-4 beats. Burden improving on metoprolol  which we will continue. If worsening, would consider amiodarone.  - Gave 4g magnesium , still low, repeat supplementation and monitoring    PAD:  - Vascular surgery referral. No limb threatening ischemia at this time.  - Start ASA, statin as above - Tobacco cessation as above   Thrombocytopenia:  - Monitor for stability, down at 76k today. No bleeding  Hypokalemia:  - Supplement again today with goal 4  Maxwell KATHEE Come, MD [facility] [email or web address] 02/15/2024, 2:26 PM

## 2024-02-16 DIAGNOSIS — Z720 Tobacco use: Secondary | ICD-10-CM | POA: Diagnosis not present

## 2024-02-16 DIAGNOSIS — I10 Essential (primary) hypertension: Secondary | ICD-10-CM

## 2024-02-16 DIAGNOSIS — F10931 Alcohol use, unspecified with withdrawal delirium: Secondary | ICD-10-CM | POA: Diagnosis not present

## 2024-02-16 DIAGNOSIS — D696 Thrombocytopenia, unspecified: Secondary | ICD-10-CM

## 2024-02-16 LAB — COMPREHENSIVE METABOLIC PANEL WITH GFR
ALT: 81 U/L — ABNORMAL HIGH (ref 0–44)
AST: 100 U/L — ABNORMAL HIGH (ref 15–41)
Albumin: 2.5 g/dL — ABNORMAL LOW (ref 3.5–5.0)
Alkaline Phosphatase: 124 U/L (ref 38–126)
Anion gap: 8 (ref 5–15)
BUN: 11 mg/dL (ref 6–20)
CO2: 25 mmol/L (ref 22–32)
Calcium: 8.3 mg/dL — ABNORMAL LOW (ref 8.9–10.3)
Chloride: 102 mmol/L (ref 98–111)
Creatinine, Ser: 0.55 mg/dL — ABNORMAL LOW (ref 0.61–1.24)
GFR, Estimated: >60 mL/min (ref >60)
Glucose, Bld: 89 mg/dL (ref 70–99)
Potassium: 3.8 mmol/L (ref 3.5–5.1)
Sodium: 135 mmol/L (ref 135–145)
Total Bilirubin: 1.6 mg/dL — ABNORMAL HIGH (ref 0.0–1.2)
Total Protein: 5.4 g/dL — ABNORMAL LOW (ref 6.5–8.1)

## 2024-02-16 LAB — MAGNESIUM: Magnesium: 1.5 mg/dL — ABNORMAL LOW (ref 1.7–2.4)

## 2024-02-16 LAB — CBC
HCT: 39.7 % (ref 39.0–52.0)
Hemoglobin: 13.9 g/dL (ref 13.0–17.0)
MCH: 38.6 pg — ABNORMAL HIGH (ref 26.0–34.0)
MCHC: 35 g/dL (ref 30.0–36.0)
MCV: 110.3 fL — ABNORMAL HIGH (ref 80.0–100.0)
Platelets: 80 K/uL — ABNORMAL LOW (ref 150–400)
RBC: 3.6 MIL/uL — ABNORMAL LOW (ref 4.22–5.81)
RDW: 13.2 % (ref 11.5–15.5)
WBC: 5.5 K/uL (ref 4.0–10.5)
nRBC: 0 % (ref 0.0–0.2)

## 2024-02-16 MED ORDER — CHLORDIAZEPOXIDE HCL 5 MG PO CAPS
5.0000 mg | ORAL_CAPSULE | Freq: Four times a day (QID) | ORAL | Status: DC
  Administered 2024-02-16 – 2024-02-17 (×3): 5 mg via ORAL
  Filled 2024-02-16 (×3): qty 1

## 2024-02-16 MED ORDER — ORAL CARE MOUTH RINSE
15.0000 mL | OROMUCOSAL | Status: DC | PRN
Start: 2024-02-16 — End: 2024-02-17

## 2024-02-16 MED ORDER — ATENOLOL 25 MG PO TABS: 25.0000 mg | ORAL_TABLET | Freq: Every day | ORAL | Status: DC

## 2024-02-16 NOTE — Progress Notes (Signed)
 Pt's wife declined to have pt receive scheduled Librium  or Ativan . She says they  make him tom sleepy and she wants him more awake so he can be steady in the morning when they go home. I told her there were no discharge orders but she says Doctor had told her he would discharge pt in the morning.

## 2024-02-16 NOTE — Progress Notes (Signed)
 Was agitated and wanted to sit in recliner. Wife got him in recliner. Pt was arguing with wife ans she stepped out of the room to get fresh air. Pt tried to get out of recliner unassisted. Nurse tech when to check on pt and found hi trying to get up unassisted, she was unable to get to him on time ans witnessed him fall sideways, land on his right led then on hip. Pt denies pain or injury. Wife was heading back to room as was told was had happed. Pt assisted back to bed.

## 2024-02-16 NOTE — Plan of Care (Signed)

## 2024-02-16 NOTE — Progress Notes (Signed)
 Now pt agreeable to have pt receive his Ativan  per CIWA score. Pt given ativan . Resting in bed at this time.

## 2024-02-16 NOTE — Progress Notes (Addendum)
 [facility] PROGRESS NOTE  Maxwell Rosales (DOB: May 29, 1971) FMW:[id number] PCP: None  Brief Narrative: Maxwell Rosales is a 53 y.o. male with a history of alcohol abuse, tobacco use, and no medical follow up who presented to the ED 8/19 with tremulousness, chronic lower extremity numbness and pain, weakness, and lightheadedness. His last drink was the night before, EtOH level 203mg /dl, and he appeared to be starting to enter alcohol withdrawal for which admission was requested. He also reported bilateral lower leg pain and work up in the ED revealed moderate occlusive proximal LE PAD, elevated LFTs, thrombocytopenia. Has had NSVT as well.   Subjective: -Wife at bedside initially resistant to additional benzos -- CIWA scores improving - Continues to require benzo - Tachycardia and tremors persist  Objective: BP 124/86   Pulse 95   Temp 98.2 F (36.8 C) (Oral)   Resp 20   Ht 5' 10.5 (1.791 m)   Wt 80.3 kg   SpO2 96%   BMI 25.04 kg/m   Gen: No distress Pulm: Clear, diminished, nonlabored  CV: RRR, rate in 80's, no MRG GI: Soft, NT, ND, +BS  Neuro: Alert,  No new focal deficits.,  Unsteady gait, tremors noted PSsych--anxious, DT type symptoms Ext: Warm, no deformities Skin: No rashes, lesions or ulcers on visualized skin   Assessment & Plan: 1)Alcohol Abuse and withdrawal--full-blown DTs - Improvement with Librium  taper and IV lorazepam  --After further discussions with patient and patient's wife they are agreeable to continue Librium  taper and as needed IV lorazepam  --DT symptoms and CIWA scores improving -UDS negative   2)Alcoholic hepatitis and ketoacidosis: Discriminant function not severely elevated with normal INR.  AST >> ALT -Bicarb and anion gap WNL - Trend LFTs, improving.    3)Tobacco use:  Smoking cessation counseling for 4 minutes today,  Give Nicotine  patch I have discussed tobacco cessation with the patient.  I have counseled the patient regarding  the negative impacts of continued tobacco use including but not limited to lung cancer, COPD, and cardiovascular disease.  I have discussed alternatives to tobacco and modalities that may help facilitate tobacco cessation including but not limited to biofeedback, hypnosis, and medications.  Total time spent with tobacco counseling was 4 minutes. --- patient is resistant to smoking cessation advised at this time  4)CAD--Coronary calcification, LBBB,  --troponin 44 >>43 --?? due to demand ischemia: No chest pain.  -Outpatient cardiology follow-up advised, may benefit from outpatient stress test -Continue aspirin  atenolol  and consider adding atorvastatin if LFTs normalize. - LDL is 114, HDL up at 76.    5)PVCs, NSVT: In setting of severe hypomagnesemia and hypokalemia and alcohol withdrawal. Asymptomatic.  - -Keep serum magnesium  above 2, keep potassium above 4 -Continue atenolol    6)PAD: --Studies reveal symmetrically abnormal bilateral ankle-brachial indices consistent with at least moderate underlying peripheral arterial disease. -- No limb threatening ischemia at this time.  - Discussed with on-call vascular surgeon Dr. Serene--- who said he will set up outpatient follow-up with patient and have his office call patient with appointment -Smoking history advised -Aspirin  as ordered -Atorvastatin if LFTs improved following   7)Thrombocytopenia: --No prior values available --Suspect due to direct toxic effect of alcohol on bone marrow, plus some component of ongoing alcoholic hepatitis with hypersplenism - Stable around 80K, no bleeding noted,   Maxwell Carwin, MD [facility] [email or web address] 02/16/2024, 3:55 PM

## 2024-02-17 DIAGNOSIS — I7409 Other arterial embolism and thrombosis of abdominal aorta: Secondary | ICD-10-CM

## 2024-02-17 DIAGNOSIS — I10 Essential (primary) hypertension: Secondary | ICD-10-CM | POA: Diagnosis not present

## 2024-02-17 DIAGNOSIS — D696 Thrombocytopenia, unspecified: Secondary | ICD-10-CM | POA: Diagnosis not present

## 2024-02-17 DIAGNOSIS — F10930 Alcohol use, unspecified with withdrawal, uncomplicated: Secondary | ICD-10-CM | POA: Diagnosis not present

## 2024-02-17 MED ORDER — NICOTINE 21 MG/24HR TD PT24
21.0000 mg | MEDICATED_PATCH | Freq: Every day | TRANSDERMAL | 0 refills | Status: AC
Start: 2024-02-18 — End: ?

## 2024-02-17 MED ORDER — ADULT MULTIVITAMIN W/MINERALS CH: 1.0000 | ORAL_TABLET | Freq: Every day | ORAL | Status: DC

## 2024-02-17 MED ORDER — FOLIC ACID 1 MG PO TABS
1.0000 mg | ORAL_TABLET | Freq: Every day | ORAL | 5 refills | Status: DC
Start: 2024-02-18 — End: 2024-03-01

## 2024-02-17 MED ORDER — TRAZODONE HCL 100 MG PO TABS: 100.0000 mg | ORAL_TABLET | Freq: Every day | ORAL | 5 refills | Status: DC

## 2024-02-17 MED ORDER — METOPROLOL TARTRATE 25 MG PO TABS
25.0000 mg | ORAL_TABLET | Freq: Two times a day (BID) | ORAL | 5 refills | Status: DC
Start: 2024-02-17 — End: 2024-03-01

## 2024-02-17 MED ORDER — ACETAMINOPHEN 325 MG PO TABS: 650.0000 mg | ORAL_TABLET | Freq: Four times a day (QID) | ORAL | Status: DC | PRN

## 2024-02-17 MED ORDER — ASPIRIN 81 MG PO TBEC: 81.0000 mg | DELAYED_RELEASE_TABLET | Freq: Every day | ORAL | 11 refills | Status: DC

## 2024-02-17 MED ORDER — VITAMIN B-1 100 MG PO TABS
100.0000 mg | ORAL_TABLET | Freq: Every day | ORAL | 5 refills | Status: DC
Start: 2024-02-18 — End: 2024-03-06

## 2024-02-17 NOTE — Progress Notes (Signed)
 Pts wife came out of the room holding a bug in her hand claiming that it came from the pts bed. Contact precautions implemented and proper people notified.

## 2024-02-17 NOTE — Plan of Care (Signed)
   Problem: Coping: Goal: Level of anxiety will decrease Outcome: Progressing

## 2024-02-17 NOTE — Discharge Summary (Signed)
 Maxwell Rosales, is a 53 y.o. male  DOB 11/17/1970  MRN [id number].  Admission date:  02/13/2024  Admitting Physician  Bernardino KATHEE Come, MD  Discharge Date:  02/17/2024   Primary MD  Pcp, No  Recommendations for primary care physician for things to follow:  1)Dr. Serene from Vascular surgery will contact you to schedule appointment with his office 2)Complete Abstinence from alcohol advised--please consider outpatient or inpatient alcohol rehab program 3)Complete Abstinence from Tobacco advised--okay to use over-the-counter nicotine  patch to help you quit smoking  Admission Diagnosis  Alcohol withdrawal (HCC) [F10.939] Hepatic steatosis [K76.0] Elevated LFTs [R79.89] Bilateral leg pain [M79.604, M79.605] NSVT (nonsustained ventricular tachycardia) (HCC) [I47.29] Abdominal aortic stenosis [Q25.1] Alcohol withdrawal syndrome without complication (HCC) [F10.930] Alcohol use disorder [F10.90] Nausea and vomiting, unspecified vomiting type [R11.2] Infrarenal abdominal aortic aneurysm (AAA) without rupture (HCC) [I71.43]  Discharge Diagnosis  Alcohol withdrawal (HCC) [F10.939] Hepatic steatosis [K76.0] Elevated LFTs [R79.89] Bilateral leg pain [M79.604, M79.605] NSVT (nonsustained ventricular tachycardia) (HCC) [I47.29] Abdominal aortic stenosis [Q25.1] Alcohol withdrawal syndrome without complication (HCC) [F10.930] Alcohol use disorder [F10.90] Nausea and vomiting, unspecified vomiting type [R11.2] Infrarenal abdominal aortic aneurysm (AAA) without rupture (HCC) [I71.43]    Principal Problem:   Alcohol withdrawal (HCC) Active Problems:   Tobacco use   Essential hypertension   Thrombocytopenia (HCC)   Hyperbilirubinemia   Coronary artery calcification   Demand ischemia (HCC)   LBBB (left bundle branch block)   Aortoiliac occlusive disease (HCC)      Past Medical History:  Diagnosis Date   Shoulder  dislocation     Past Surgical History:  Procedure Laterality Date   APPENDECTOMY       HPI  from the history and physical done on the day of admission:   HPI: Maxwell Rosales is a 53 y.o. male with a history of alcohol abuse, tobacco use, and no medical follow up who presented to the ED this morning with tremulousness, chronic lower extremity numbness and pain, weakness, and lightheadedness. He reports drinking less than a fifth of whiskey daily of late, has slowed down and stopped before without withdrawal seizures. Never been admitted for withdrawal. Last drink was the evening prior to arrival, EtOH level still elevated at 205 in the ED. He is aware that he needs to stop drinking and is amenable to plan for cessation and detox as inpatient.    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 No change of late, but it is a severe symptom for him.    In the ED he was tachycardic and tremulous. Work up in the ED revealed moderate occlusive proximal LE PAD, elevated LFTs, thrombocytopenia. Admission requested for alcohol withdrawal.    Review of Systems: As mentioned in the history of present illness. All other systems reviewed and are negative.    Hospital Course:     Assessment and Plan: 1)Alcohol Abuse and withdrawal--full-blown DTs - Improvement with Librium  taper and IV  lorazepam  --DT symptoms resolved and CIWA scores improved -UDS negative -Patient and wife declined further Librium  taper at discharge -Outpatient or inpatient alcohol rehab program advised   2)Alcoholic hepatitis and ketoacidosis: Discriminant function not severely elevated with normal INR.  AST >> ALT -Bicarb and anion gap WNL - Trend LFTs, improving.     Latest Ref Rng & Units 02/16/2024    4:45 AM 02/15/2024    4:57 AM 02/14/2024    1:05 AM  Hepatic Function  Total Protein 6.5 - 8.1 g/dL 5.4  5.4  5.5   Albumin 3.5 -  5.0 g/dL 2.5  2.5  2.9   AST 15 - 41 U/L 100  132  183   ALT 0 - 44 U/L 81  85  97   Alk Phosphatase 38 - 126 U/L 124  125  130   Total Bilirubin 0.0 - 1.2 mg/dL 1.6  2.7  4.0    3)Tobacco use:  Smoking cessation advised especially in the setting of PAD, - use nicotine  patch    4)CAD--Coronary calcification, LBBB,  --troponin 44 >>43 --?? due to demand ischemia: No chest pain.  -Outpatient cardiology follow-up advised, may benefit from outpatient stress test -Continue aspirin  and metoprolol  and consider adding atorvastatin if LFTs normalize. - LDL is 114, HDL up at 76.     5)PVCs, NSVT: In setting of severe hypomagnesemia and hypokalemia and alcohol withdrawal. Asymptomatic.  - -Keep serum magnesium  above 2, keep potassium above 4 -Continue atenolol    6)PAD: --Studies reveal symmetrically abnormal bilateral ankle-brachial indices consistent with at least moderate underlying peripheral arterial disease. -- No limb threatening ischemia at this time.  -Patient reports intermittent claudication with walking/prolonged activity - Discussed with on-call vascular surgeon Dr. Serene--- who said he will set up outpatient follow-up with patient and have his office call patient with appointment -Smoking history advised -Aspirin  as ordered -Atorvastatin if LFTs improved following   7)Thrombocytopenia: --No prior values available --Suspect due to direct toxic effect of alcohol on bone marrow, plus some component of ongoing alcoholic hepatitis with hypersplenism - Stable around 80K, no bleeding noted,  Discharge Condition: Stable  Follow UP   Follow-up Information     [facility]. Call.   Why: Call to make an appointment for assist with substance abuse. Contact information: [phone] [address] [place], [place] [address]        Serene Gaile ORN, MD. Schedule an appointment as soon as possible for a visit in 1 week(s).   Specialties:  Vascular Surgery, Cardiology Contact information: [address] [place] [address] [phone]         Johnson Laymon HERO, [place]. Call in 1 week(s).   Specialties: Cardiology, Cardiology Contact information: [address] [place] [address] [phone]                 Diet and Activity recommendation:  As advised  Discharge Instructions    Discharge Instructions     Call MD for:  difficulty breathing, headache or visual disturbances   Complete by: As directed    Call MD for:  persistant dizziness or light-headedness   Complete by: As directed    Call MD for:  persistant nausea and vomiting   Complete by: As directed    Call MD for:  temperature >100.4   Complete by: As directed    Diet - low sodium heart healthy   Complete by: As directed    Discharge instructions   Complete by:  As directed    1)Dr. Serene from Vascular surgery will contact you to schedule appointment with his office 2)Complete Abstinence from alcohol advised--please consider outpatient or inpatient alcohol rehab program 3)Complete Abstinence from Tobacco advised--okay to use over-the-counter nicotine  patch to help you quit smoking   Increase activity slowly   Complete by: As directed          Discharge Medications     Allergies as of 02/17/2024   No Known Allergies      Medication List     STOP taking these medications    ibuprofen  200 MG tablet Commonly known as: ADVIL        TAKE these medications    acetaminophen  325 MG tablet Commonly known as: TYLENOL  Take 2 tablets (650 mg total) by mouth every 6 (six) hours as needed for mild pain (pain score 1-3) or fever (or Fever >/= 101).   aspirin  EC 81 MG tablet Take 1 tablet (81 mg total) by mouth daily with breakfast. Swallow whole. What changed: when to take this   folic acid  1 MG tablet Commonly known as: FOLVITE  Take 1 tablet (1 mg total) by mouth daily. Start taking on: February 18, 2024   metoprolol   tartrate 25 MG tablet Commonly known as: LOPRESSOR  Take 1 tablet (25 mg total) by mouth 2 (two) times daily.   multivitamin with minerals Tabs tablet Take 1 tablet by mouth daily. Start taking on: February 18, 2024   NEURIVA PO Take 1 capsule by mouth daily. For nerve pain   nicotine  21 mg/24hr patch Commonly known as: NICODERM CQ  - dosed in mg/24 hours Place 1 patch (21 mg total) onto the skin daily. Start taking on: February 18, 2024   thiamine  100 MG tablet Commonly known as: Vitamin B-1 Take 1 tablet (100 mg total) by mouth daily. Start taking on: February 18, 2024   traZODone  100 MG tablet Commonly known as: DESYREL  Take 1 tablet (100 mg total) by mouth at bedtime.        Major procedures and Radiology Reports - PLEASE review detailed and final reports for all details, in brief -   US  ARTERIAL ABI (SCREENING LOWER EXTREMITY) Result Date: 02/13/2024 CLINICAL DATA:  53 year old male with bilateral lower extremity pain and weakness EXAM: NONINVASIVE PHYSIOLOGIC VASCULAR STUDY OF BILATERAL LOWER EXTREMITIES TECHNIQUE: Evaluation of both lower extremities were performed at rest, including calculation of ankle-brachial indices with single level Doppler, pressure and pulse volume recording. COMPARISON:  CT scan of the abdomen and pelvis 02/13/2024 FINDINGS: Right ABI:  0.79 Left ABI:  0.75 Right Lower Extremity: Abnormal monophasic arterial waveform at the ankle. Left Lower Extremity: Abnormal monophasic arterial waveform at the ankle. 0.5-0.79 Moderate PAD IMPRESSION: 1. Symmetrically abnormal bilateral ankle-brachial indices consistent with at least moderate underlying peripheral arterial disease. 2. Correlation with prior CT imaging confirms aortoiliac occlusive disease. Electronically Signed   By: Wilkie Lent M.D.   On: 02/13/2024 11:08   CT Angio Chest PE W and/or Wo Contrast Result Date: 02/13/2024 EXAM: CTA CHEST AORTA 02/13/2024 09:10:22 AM TECHNIQUE: CTA of the chest was  performed after the administration of intravenous contrast. Multiplanar reformatted images are provided for review. MIP images are provided for review. Automated exposure control, iterative reconstruction, and/or weight based adjustment of the mA/kV was utilized to reduce the radiation dose to as low as reasonably achievable. COMPARISON: None available. CLINICAL HISTORY: Pulmonary embolism (PE) suspected, high prob. Pt c/o vomiting, generalized weakness, and lightheadedness x today, pt does endorse smoking 2 packs  of cigarettes/day and ETOH use daily. Denies CP and shob at this time. FINDINGS: AORTA: No thoracic aortic dissection. No aneurysm. There is mild calcific atheromatous disease within the aortic arch. MEDIASTINUM: No mediastinal lymphadenopathy. The heart is mildly enlarged. There is moderate calcific coronary artery disease present. LYMPH NODES: No mediastinal, hilar or axillary lymphadenopathy. LUNGS AND PLEURA: There are peripheral pulmonary blebs present. There are ground-glass opacities present dependently within the lower lobes bilaterally. No focal consolidation or pulmonary edema. No pleural effusion or pneumothorax. UPPER ABDOMEN: Limited images of the upper abdomen are unremarkable. SOFT TISSUES AND BONES: No acute bone or soft tissue abnormality. IMPRESSION: 1. No evidence of pulmonary embolism. 2. Mildly enlarged heart with moderate calcific coronary artery disease. 3. Mild calcific atheromatous disease within the aortic arch. Electronically signed by: Evalene Coho MD 02/13/2024 09:43 AM EDT RP Workstation: [id number]   CT ABDOMEN PELVIS W CONTRAST Result Date: 02/13/2024 EXAM: CT ABDOMEN AND PELVIS WITH CONTRAST 02/13/2024 09:10:22 AM TECHNIQUE: CT of the abdomen and pelvis was performed with the administration of intravenous contrast. Multiplanar reformatted images are provided for review. Automated exposure control, iterative reconstruction, and/or weight based adjustment of the mA/kV  was utilized to reduce the radiation dose to as low as reasonably achievable. COMPARISON: None available. CLINICAL HISTORY: Nausea/vomiting, elevated LFTs. Pt c/o vomiting, generalized weakness, and lightheadedness x today, pt does endorse smoking 2 packs of cigarettes/day and ETOH use daily. Denies CP and shob at this time. FINDINGS: LOWER CHEST: No acute abnormality. LIVER: There is marked fatty infiltration of the liver. GALLBLADDER AND BILE DUCTS: There is radiopaque debris layering dependently within the gallbladder. No biliary ductal dilatation. SPLEEN: No acute abnormality. PANCREAS: No acute abnormality. ADRENAL GLANDS: No acute abnormality. KIDNEYS, URETERS AND BLADDER: No stones in the kidneys or ureters. No hydronephrosis. No perinephric or periureteral stranding. Urinary bladder is unremarkable. GI AND BOWEL: There are numerous scattered colonic diverticula present. Stomach demonstrates no acute abnormality. There is no bowel obstruction. No bowel wall thickening. PERITONEUM AND RETROPERITONEUM: No ascites. No free air. VASCULATURE: There is mild fusiform aneurysmal dilatation of the abdominal aorta which measures up to 3.4 x 2.9 cm in cross-section. There is extensive calcific and noncalcific plaque present within the abdominal aorta. There is near occlusive stenosis of the aortic bifurcation and origins of the internal iliac arteries bilaterally. LYMPH NODES: No lymphadenopathy. REPRODUCTIVE ORGANS: No acute abnormality. BONES AND SOFT TISSUES: No acute osseous abnormality. No focal soft tissue abnormality. IMPRESSION: 1. 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 2. 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 3. Marked hepatic steatosis. 4. Radiopaque debris layering dependently within the gallbladder. Electronically signed by:  Evalene Coho MD 02/13/2024 09:41 AM EDT RP Workstation: [id number]   CT Head Wo Contrast Result Date: 02/13/2024 EXAM: CT HEAD WITHOUT CONTRAST 02/13/2024 09:10:22 AM TECHNIQUE: CT of the head was performed without the administration of intravenous contrast. Automated exposure control, iterative reconstruction, and/or weight based adjustment of the mA/kV was utilized to reduce the radiation dose to as low as reasonably achievable. COMPARISON: None available. CLINICAL HISTORY: Visual changes, balance issues. Patient complains of vomiting, generalized weakness, and lightheadedness today. Patient endorses smoking 2 packs of cigarettes per day and alcohol use daily. Denies chest pain and shortness of breath at this time. FINDINGS: BRAIN AND VENTRICLES: No acute hemorrhage. Gray-white differentiation is preserved. No hydrocephalus. No extra-axial  collection. No mass effect or midline shift. ORBITS: No acute abnormality. SINUSES: No acute abnormality. SOFT TISSUES AND SKULL: No acute soft tissue abnormality. No skull fracture. VASCULATURE: There are calcifications within the carotid siphons. IMPRESSION: 1. No acute intracranial abnormality. 2. Calcifications within the carotid siphons. Electronically signed by: Evalene Coho MD 02/13/2024 09:25 AM EDT RP Workstation: [id number]   DG Chest Portable 1 View Result Date: 02/13/2024 CLINICAL DATA:  SOB EXAM: DG CHEST 1V PORT COMPARISON:  None available. FINDINGS: No focal airspace consolidation, pleural effusion, or pneumothorax. No cardiomegaly. No acute fracture or destructive lesions. Multilevel thoracic osteophytosis. IMPRESSION: No acute cardiopulmonary abnormality. Electronically Signed   By: Rogelia Myers M.D.   On: 02/13/2024 08:24   Today   Subjective    Maxwell Rosales today has no new complaints  No fever  Or chills   No Nausea, Vomiting or Diarrhea Wife at bedside, questions answered        Patient has been seen and examined prior to  discharge   Objective   Blood pressure (!) 142/88, pulse 96, temperature 98.6 F (37 C), temperature source Oral, resp. rate 16, height 5' 10.5 (1.791 m), weight 80.3 kg, SpO2 100%.   Intake/Output Summary (Last 24 hours) at 02/17/2024 1251 Last data filed at 02/17/2024 0924 Gross per 24 hour  Intake 720 ml  Output --  Net 720 ml    Exam Gen:- Awake Alert, no acute distress  HEENT:- [place].AT, No sclera icterus Neck-Supple Neck,No JVD,.  Lungs-  CTAB , good air movement bilaterally CV- S1, S2 normal, regular Abd-  +ve B.Sounds, Abd Soft, No tenderness,    Extremity/Skin:- No  edema,   good pulses Psych-affect is appropriate, oriented x3 Neuro-no new focal deficits, no significant tremors    Data Review   CBC w Diff:  Lab Results  Component Value Date   WBC 5.5 02/16/2024   HGB 13.9 02/16/2024   HCT 39.7 02/16/2024   PLT 80 (L) 02/16/2024   LYMPHOPCT 21 02/13/2024   MONOPCT 8 02/13/2024   EOSPCT 0 02/13/2024   BASOPCT 1 02/13/2024   CMP:  Lab Results  Component Value Date   NA 135 02/16/2024   K 3.8 02/16/2024   CL 102 02/16/2024   CO2 25 02/16/2024   BUN 11 02/16/2024   CREATININE 0.55 (L) 02/16/2024   PROT 5.4 (L) 02/16/2024   ALBUMIN 2.5 (L) 02/16/2024   BILITOT 1.6 (H) 02/16/2024   ALKPHOS 124 02/16/2024   AST 100 (H) 02/16/2024   ALT 81 (H) 02/16/2024   Total Discharge time is about 33 minutes  Rendall Carwin M.D on 02/17/2024 at 12:51 PM  Go to [email or web address] -  for contact info  [facility] - Office  [phone]

## 2024-02-27 ENCOUNTER — Telehealth: Payer: Self-pay

## 2024-02-27 NOTE — Telephone Encounter (Unsigned)
 Copied from CRM #[id number]. Topic: Appointments - Scheduling Inquiry for Clinic >> Feb 27, 2024  3:28 PM Donee H wrote: Reason for CRM: Patient's wife Iktan Aikman called to get up a Hospital follow up for patient. Patient is not an existing patient. He was discharged on Aug. 23, 2025. No openings are showing until Jan. 2026. Patient does not have a specific provider but would like to be seen at this location. He is asking for appointment after 3 if possible or on a Friday anytime is okay. Callback number [phone] Joen Dimes

## 2024-02-27 NOTE — Telephone Encounter (Signed)
 scheduled

## 2024-03-01 ENCOUNTER — Encounter: Payer: Self-pay | Admitting: Nurse Practitioner

## 2024-03-01 ENCOUNTER — Ambulatory Visit (INDEPENDENT_AMBULATORY_CARE_PROVIDER_SITE_OTHER): Admitting: Nurse Practitioner

## 2024-03-01 VITALS — BP 146/84 | HR 94 | Ht 70.0 in | Wt 183.0 lb

## 2024-03-01 DIAGNOSIS — I10 Essential (primary) hypertension: Secondary | ICD-10-CM | POA: Diagnosis not present

## 2024-03-01 DIAGNOSIS — E538 Deficiency of other specified B group vitamins: Secondary | ICD-10-CM | POA: Diagnosis not present

## 2024-03-01 DIAGNOSIS — F5101 Primary insomnia: Secondary | ICD-10-CM

## 2024-03-01 DIAGNOSIS — I7409 Other arterial embolism and thrombosis of abdominal aorta: Secondary | ICD-10-CM

## 2024-03-01 MED ORDER — TRAZODONE HCL 150 MG PO TABS: 150.0000 mg | ORAL_TABLET | Freq: Every day | ORAL | 1 refills | Status: DC

## 2024-03-01 MED ORDER — METOPROLOL TARTRATE 25 MG PO TABS: 25.0000 mg | ORAL_TABLET | Freq: Two times a day (BID) | ORAL | 3 refills | Status: DC

## 2024-03-01 MED ORDER — FOLIC ACID 1 MG PO TABS: 1.0000 mg | ORAL_TABLET | Freq: Every day | ORAL | 3 refills | Status: DC

## 2024-03-01 NOTE — Patient Instructions (Signed)
 1) Insomnia - increase trazodone  to 150 mg nightly at bedtime 2) Patient will have labs with vascular surgery including LFTs 3) Follow up appt in 4 months, will have fasting labs prior

## 2024-03-01 NOTE — Progress Notes (Signed)
 New Patient Office Visit  Subjective    Patient ID: Maxwell Rosales, male    DOB: June 22, 1971  Age: 53 y.o. MRN: [id number]  CC:  Chief Complaint  Patient presents with   Establish Care    HPI Maxwell Rosales presents to establish care Patient here today as a new patient to establish primary care.  He does state not sleeping with current trazodone  dosing of 100 mg nightly.  Patient will be seeing vascular surgery on Oct 1st for aortic obstruction.  Patient needs refill on metoprolol . Patient is concerned about elevated LFTs, but will most likely have labs with appt at vasc surgery so will wait and see.  If not, patient will contact this provider and will recheck.    Outpatient Encounter Medications as of 03/01/2024  Medication Sig   acetaminophen  (TYLENOL ) 325 MG tablet Take 2 tablets (650 mg total) by mouth every 6 (six) hours as needed for mild pain (pain score 1-3) or fever (or Fever >/= 101).   aspirin  EC 81 MG tablet Take 1 tablet (81 mg total) by mouth daily with breakfast. Swallow whole.   folic acid  (FOLVITE ) 1 MG tablet Take 1 tablet (1 mg total) by mouth daily.   metoprolol  tartrate (LOPRESSOR ) 25 MG tablet Take 1 tablet (25 mg total) by mouth 2 (two) times daily.   Misc Natural Products (NEURIVA PO) Take 1 capsule by mouth daily. For nerve pain   traZODone  (DESYREL ) 100 MG tablet Take 1 tablet (100 mg total) by mouth at bedtime.   Multiple Vitamin (MULTIVITAMIN WITH MINERALS) TABS tablet Take 1 tablet by mouth daily. (Patient not taking: Reported on 03/01/2024)   nicotine  (NICODERM CQ  - DOSED IN MG/24 HOURS) 21 mg/24hr patch Place 1 patch (21 mg total) onto the skin daily. (Patient not taking: Reported on 03/01/2024)   thiamine  (VITAMIN B-1) 100 MG tablet Take 1 tablet (100 mg total) by mouth daily. (Patient not taking: Reported on 03/01/2024)   No facility-administered encounter medications on file as of 03/01/2024.    Past Medical History:  Diagnosis Date   Shoulder  dislocation     Past Surgical History:  Procedure Laterality Date   APPENDECTOMY      No family history on file.  Social History   Socioeconomic History   Marital status: Married    Spouse name: Not on file   Number of children: Not on file   Years of education: Not on file   Highest education level: Not on file  Occupational History   Not on file  Tobacco Use   Smoking status: Every Day   Smokeless tobacco: Not on file  Substance and Sexual Activity   Alcohol use: Yes    Comment: occasionally   Drug use: No   Sexual activity: Not on file  Other Topics Concern   Not on file  Social History Narrative   Not on file   Social Drivers of Health   Financial Resource Strain: Not on file  Food Insecurity: Patient Declined (02/14/2024)   Hunger Vital Sign    Worried About Running Out of Food in the Last Year: Patient declined    Ran Out of Food in the Last Year: Patient declined  Transportation Needs: Patient Declined (02/14/2024)   PRAPARE - Administrator, Civil Service (Medical): Patient declined    Lack of Transportation (Non-Medical): Patient declined  Physical Activity: Not on file  Stress: Not on file  Social Connections: Not on file  Intimate Partner  Violence: Patient Declined (02/14/2024)   Humiliation, Afraid, Rape, and Kick questionnaire    Fear of Current or Ex-Partner: Patient declined    Emotionally Abused: Patient declined    Physically Abused: Patient declined    Sexually Abused: Patient declined    ROS      Objective    BP (!) 146/84   Pulse 94   Ht 5' 10 (1.778 m)   Wt 183 lb (83 kg)   SpO2 96%   BMI 26.26 kg/m   Physical Exam Vitals and nursing note reviewed.  Constitutional:      Appearance: Normal appearance.  HENT:     Head: Normocephalic.     Nose: Nose normal.     Mouth/Throat:     Mouth: Mucous membranes are moist.  Cardiovascular:     Rate and Rhythm: Normal rate and regular rhythm.     Pulses: Normal pulses.      Heart sounds: Normal heart sounds.  Pulmonary:     Effort: Pulmonary effort is normal.     Breath sounds: Normal breath sounds.  Musculoskeletal:        General: Tenderness present.     Cervical back: Normal range of motion and neck supple.  Skin:    General: Skin is warm and dry.  Neurological:     Mental Status: He is alert and oriented to person, place, and time.  Psychiatric:        Mood and Affect: Mood normal.        Behavior: Behavior normal.         Assessment & Plan:  1) Insomnia - increase trazodone  to 150 mg nightly at bedtime 2) Patient will have labs with vascular surgery including LFTs 3) Follow up appt in 4 months, will have fasting labs prior   Problem List Items Addressed This Visit   None   No follow-ups on file.   Neale Carpen, NP

## 2024-03-06 ENCOUNTER — Emergency Department (HOSPITAL_COMMUNITY)

## 2024-03-06 ENCOUNTER — Inpatient Hospital Stay (HOSPITAL_COMMUNITY)
Admission: EM | Admit: 2024-03-06 | Discharge: 2024-03-07 | DRG: 446 | Disposition: A | Discharge status: Home / Self Care | Attending: Family Medicine | Admitting: Family Medicine

## 2024-03-06 ENCOUNTER — Other Ambulatory Visit: Payer: Self-pay

## 2024-03-06 ENCOUNTER — Encounter (HOSPITAL_COMMUNITY): Payer: Self-pay

## 2024-03-06 DIAGNOSIS — Z7982 Long term (current) use of aspirin: Secondary | ICD-10-CM | POA: Diagnosis not present

## 2024-03-06 DIAGNOSIS — I447 Left bundle-branch block, unspecified: Secondary | ICD-10-CM | POA: Diagnosis present

## 2024-03-06 DIAGNOSIS — I2510 Atherosclerotic heart disease of native coronary artery without angina pectoris: Secondary | ICD-10-CM | POA: Diagnosis present

## 2024-03-06 DIAGNOSIS — F17200 Nicotine dependence, unspecified, uncomplicated: Secondary | ICD-10-CM | POA: Diagnosis present

## 2024-03-06 DIAGNOSIS — E785 Hyperlipidemia, unspecified: Secondary | ICD-10-CM | POA: Diagnosis present

## 2024-03-06 DIAGNOSIS — I700 Atherosclerosis of aorta: Secondary | ICD-10-CM | POA: Diagnosis present

## 2024-03-06 DIAGNOSIS — K8010 Calculus of gallbladder with chronic cholecystitis without obstruction: Secondary | ICD-10-CM | POA: Diagnosis present

## 2024-03-06 DIAGNOSIS — I350 Nonrheumatic aortic (valve) stenosis: Secondary | ICD-10-CM | POA: Diagnosis present

## 2024-03-06 DIAGNOSIS — Z79899 Other long term (current) drug therapy: Secondary | ICD-10-CM | POA: Diagnosis not present

## 2024-03-06 DIAGNOSIS — I739 Peripheral vascular disease, unspecified: Secondary | ICD-10-CM | POA: Diagnosis present

## 2024-03-06 DIAGNOSIS — I10 Essential (primary) hypertension: Secondary | ICD-10-CM | POA: Diagnosis present

## 2024-03-06 DIAGNOSIS — K810 Acute cholecystitis: Principal | ICD-10-CM | POA: Diagnosis present

## 2024-03-06 DIAGNOSIS — K819 Cholecystitis, unspecified: Secondary | ICD-10-CM | POA: Diagnosis present

## 2024-03-06 DIAGNOSIS — D696 Thrombocytopenia, unspecified: Secondary | ICD-10-CM | POA: Diagnosis present

## 2024-03-06 DIAGNOSIS — K700 Alcoholic fatty liver: Secondary | ICD-10-CM | POA: Diagnosis not present

## 2024-03-06 DIAGNOSIS — R079 Chest pain, unspecified: Secondary | ICD-10-CM | POA: Diagnosis not present

## 2024-03-06 DIAGNOSIS — R0789 Other chest pain: Secondary | ICD-10-CM | POA: Diagnosis present

## 2024-03-06 DIAGNOSIS — I7143 Infrarenal abdominal aortic aneurysm, without rupture: Secondary | ICD-10-CM | POA: Diagnosis present

## 2024-03-06 DIAGNOSIS — K7010 Alcoholic hepatitis without ascites: Secondary | ICD-10-CM

## 2024-03-06 DIAGNOSIS — G8929 Other chronic pain: Secondary | ICD-10-CM | POA: Diagnosis present

## 2024-03-06 DIAGNOSIS — I7409 Other arterial embolism and thrombosis of abdominal aorta: Secondary | ICD-10-CM | POA: Diagnosis present

## 2024-03-06 DIAGNOSIS — R945 Abnormal results of liver function studies: Secondary | ICD-10-CM | POA: Diagnosis not present

## 2024-03-06 DIAGNOSIS — K8000 Calculus of gallbladder with acute cholecystitis without obstruction: Secondary | ICD-10-CM | POA: Diagnosis not present

## 2024-03-06 DIAGNOSIS — R7989 Other specified abnormal findings of blood chemistry: Secondary | ICD-10-CM

## 2024-03-06 DIAGNOSIS — Z720 Tobacco use: Secondary | ICD-10-CM | POA: Diagnosis present

## 2024-03-06 DIAGNOSIS — F1010 Alcohol abuse, uncomplicated: Secondary | ICD-10-CM | POA: Diagnosis present

## 2024-03-06 DIAGNOSIS — I5021 Acute systolic (congestive) heart failure: Secondary | ICD-10-CM

## 2024-03-06 DIAGNOSIS — F1090 Alcohol use, unspecified, uncomplicated: Secondary | ICD-10-CM | POA: Diagnosis not present

## 2024-03-06 HISTORY — DX: Alcohol dependence, uncomplicated: F10.20

## 2024-03-06 LAB — TROPONIN I (HIGH SENSITIVITY)
Troponin I (High Sensitivity): 18 ng/L — ABNORMAL HIGH (ref <18)
Troponin I (High Sensitivity): 19 ng/L — ABNORMAL HIGH (ref <18)

## 2024-03-06 LAB — CBC WITH DIFFERENTIAL/PLATELET
Abs Immature Granulocytes: 0.06 K/uL (ref 0.00–0.07)
Basophils Absolute: 0.1 K/uL (ref 0.0–0.1)
Basophils Relative: 1 %
Eosinophils Absolute: 0.1 K/uL (ref 0.0–0.5)
Eosinophils Relative: 1 %
HCT: 41.3 % (ref 39.0–52.0)
Hemoglobin: 14.6 g/dL (ref 13.0–17.0)
Immature Granulocytes: 1 %
Lymphocytes Relative: 14 %
Lymphs Abs: 1.8 K/uL (ref 0.7–4.0)
MCH: 37.4 pg — ABNORMAL HIGH (ref 26.0–34.0)
MCHC: 35.4 g/dL (ref 30.0–36.0)
MCV: 105.9 fL — ABNORMAL HIGH (ref 80.0–100.0)
Monocytes Absolute: 1 K/uL (ref 0.1–1.0)
Monocytes Relative: 8 %
Neutro Abs: 9.8 K/uL — ABNORMAL HIGH (ref 1.7–7.7)
Neutrophils Relative %: 75 %
Platelets: 210 K/uL (ref 150–400)
RBC: 3.9 MIL/uL — ABNORMAL LOW (ref 4.22–5.81)
RDW: 13.4 % (ref 11.5–15.5)
WBC: 12.8 K/uL — ABNORMAL HIGH (ref 4.0–10.5)
nRBC: 0 % (ref 0.0–0.2)

## 2024-03-06 LAB — COMPREHENSIVE METABOLIC PANEL WITH GFR
ALT: 197 U/L — ABNORMAL HIGH (ref 0–44)
AST: 251 U/L — ABNORMAL HIGH (ref 15–41)
Albumin: 3 g/dL — ABNORMAL LOW (ref 3.5–5.0)
Alkaline Phosphatase: 152 U/L — ABNORMAL HIGH (ref 38–126)
Anion gap: 14 (ref 5–15)
BUN: 6 mg/dL (ref 6–20)
CO2: 24 mmol/L (ref 22–32)
Calcium: 8.7 mg/dL — ABNORMAL LOW (ref 8.9–10.3)
Chloride: 97 mmol/L — ABNORMAL LOW (ref 98–111)
Creatinine, Ser: 0.85 mg/dL (ref 0.61–1.24)
GFR, Estimated: >60 mL/min (ref >60)
Glucose, Bld: 94 mg/dL (ref 70–99)
Potassium: 3.6 mmol/L (ref 3.5–5.1)
Sodium: 135 mmol/L (ref 135–145)
Total Bilirubin: 1.4 mg/dL — ABNORMAL HIGH (ref 0.0–1.2)
Total Protein: 6.2 g/dL — ABNORMAL LOW (ref 6.5–8.1)

## 2024-03-06 LAB — D-DIMER, QUANTITATIVE: D-Dimer, Quant: 1.45 ug{FEU}/mL — ABNORMAL HIGH (ref 0.00–0.50)

## 2024-03-06 MED ORDER — LORAZEPAM 1 MG PO TABS
0.0000 mg | ORAL_TABLET | Freq: Four times a day (QID) | ORAL | Status: DC
  Administered 2024-03-06: 1 mg via ORAL
  Filled 2024-03-06: qty 1

## 2024-03-06 MED ORDER — METRONIDAZOLE 500 MG/100ML IV SOLN
500.0000 mg | Freq: Two times a day (BID) | INTRAVENOUS | Status: DC
  Administered 2024-03-07: 500 mg via INTRAVENOUS
  Filled 2024-03-06 (×2): qty 100

## 2024-03-06 MED ORDER — MELATONIN 3 MG PO TABS
6.0000 mg | ORAL_TABLET | Freq: Every evening | ORAL | Status: DC | PRN
  Administered 2024-03-06: 6 mg via ORAL
  Filled 2024-03-06: qty 2

## 2024-03-06 MED ORDER — THIAMINE HCL 100 MG/ML IJ SOLN: 100.0000 mg | Freq: Every day | INTRAMUSCULAR | Status: DC

## 2024-03-06 MED ORDER — NICOTINE 21 MG/24HR TD PT24
21.0000 mg | MEDICATED_PATCH | Freq: Every day | TRANSDERMAL | Status: DC
  Administered 2024-03-07: 21 mg via TRANSDERMAL
  Filled 2024-03-06: qty 1

## 2024-03-06 MED ORDER — NICOTINE 21 MG/24HR TD PT24
21.0000 mg | MEDICATED_PATCH | Freq: Once | TRANSDERMAL | Status: DC
  Administered 2024-03-06: 21 mg via TRANSDERMAL
  Filled 2024-03-06: qty 1

## 2024-03-06 MED ORDER — SODIUM CHLORIDE 0.9 % IV SOLN
2.0000 g | INTRAVENOUS | Status: DC
  Administered 2024-03-06: 2 g via INTRAVENOUS
  Filled 2024-03-06: qty 20

## 2024-03-06 MED ORDER — IOHEXOL 350 MG/ML SOLN
75.0000 mL | Freq: Once | INTRAVENOUS | Status: AC | PRN
  Administered 2024-03-06: 75 mL via INTRAVENOUS

## 2024-03-06 MED ORDER — METOPROLOL TARTRATE 25 MG PO TABS
25.0000 mg | ORAL_TABLET | Freq: Two times a day (BID) | ORAL | Status: DC
  Administered 2024-03-06 – 2024-03-07 (×2): 25 mg via ORAL
  Filled 2024-03-06 (×2): qty 1

## 2024-03-06 MED ORDER — ALBUTEROL SULFATE (2.5 MG/3ML) 0.083% IN NEBU: 2.5000 mg | INHALATION_SOLUTION | RESPIRATORY_TRACT | Status: DC | PRN

## 2024-03-06 MED ORDER — FOLIC ACID 1 MG PO TABS
1.0000 mg | ORAL_TABLET | Freq: Every day | ORAL | Status: DC
  Administered 2024-03-07: 1 mg via ORAL
  Filled 2024-03-06: qty 1

## 2024-03-06 MED ORDER — THIAMINE MONONITRATE 100 MG PO TABS
100.0000 mg | ORAL_TABLET | Freq: Every day | ORAL | Status: DC
  Administered 2024-03-06 – 2024-03-07 (×2): 100 mg via ORAL
  Filled 2024-03-06 (×2): qty 1

## 2024-03-06 MED ORDER — LORAZEPAM 2 MG/ML IJ SOLN: 0.0000 mg | Freq: Two times a day (BID) | INTRAMUSCULAR | Status: DC

## 2024-03-06 MED ORDER — LORAZEPAM 2 MG/ML IJ SOLN: 0.0000 mg | Freq: Four times a day (QID) | INTRAMUSCULAR | Status: DC

## 2024-03-06 MED ORDER — HYDRALAZINE HCL 20 MG/ML IJ SOLN: 5.0000 mg | INTRAMUSCULAR | Status: DC | PRN

## 2024-03-06 MED ORDER — TRAZODONE HCL 50 MG PO TABS
150.0000 mg | ORAL_TABLET | Freq: Every day | ORAL | Status: DC
  Filled 2024-03-06: qty 3

## 2024-03-06 MED ORDER — METOPROLOL TARTRATE 25 MG PO TABS
25.0000 mg | ORAL_TABLET | Freq: Two times a day (BID) | ORAL | Status: DC
Start: 2024-03-06 — End: 2024-03-06

## 2024-03-06 MED ORDER — LORAZEPAM 1 MG PO TABS: 0.0000 mg | ORAL_TABLET | Freq: Two times a day (BID) | ORAL | Status: DC

## 2024-03-06 MED ORDER — HYDRALAZINE HCL 20 MG/ML IJ SOLN: 10.0000 mg | INTRAMUSCULAR | Status: DC | PRN

## 2024-03-06 MED ORDER — ADULT MULTIVITAMIN W/MINERALS CH
1.0000 | ORAL_TABLET | Freq: Every day | ORAL | Status: DC
  Administered 2024-03-06 – 2024-03-07 (×2): 1 via ORAL
  Filled 2024-03-06 (×2): qty 1

## 2024-03-06 MED ORDER — HEPARIN SODIUM (PORCINE) 5000 UNIT/ML IJ SOLN: 5000.0000 [IU] | Freq: Three times a day (TID) | INTRAMUSCULAR | Status: DC

## 2024-03-06 MED ORDER — MORPHINE SULFATE (PF) 2 MG/ML IV SOLN: 2.0000 mg | INTRAVENOUS | Status: DC | PRN

## 2024-03-06 MED ORDER — OXYCODONE HCL 5 MG PO TABS: 5.0000 mg | ORAL_TABLET | ORAL | Status: DC | PRN

## 2024-03-06 NOTE — H&P (Signed)
 [facility] H&P   Patient Demographics:    Maxwell Rosales, is a 53 y.o. male  MRN: [id number]   DOB - [birth year]  Admit Date - 03/06/2024  Outpatient Primary MD for the patient is Glennon Sand, NP  Referring MD/NP/PA: dr Franklyn    Patient coming from: home  Chief Complaint  Patient presents with   Chest Pain      HPI:    Maxwell Rosales  is a 53 y.o. male, past medical history of alcohol abuse, tobacco abuse, CAD, hypertension, hyperlipidemia, PAD, with recent hospitalization secondary to alcohol withdrawals, he was discharged on 02/17/2024, he had total abstinence from alcohol since discharge, patient is planned to see vascular surgery tomorrow Dr. Silver in [place]. - Patient presents to ED secondary to complaints of chest pain, reports central, nonradiating, severe chest pain, began this afternoon while driving, not exertional, reports another incident last week, no relieving, no provoking factor, any leg swelling, hemoptysis, no fever, no chills. - In ED EKG at baseline, troponins lower than his recent baseline x 218> 19, CTA chest negative for PE but concerning for acute cholecystitis, as were significant for elevated alk phos at 152, AST at 251, ALT at 197, total bili at 1.4, ED discussed with general surgery who recommended admission to hospitalist service.  And Dr. Mavis will evaluate in a.m.     Review of systems:      A full 10 point Review of Systems was done, except as stated above, all other Review of Systems were negative.   With Past History of the following :    Past Medical History:  Diagnosis Date   Shoulder dislocation   Alcohol abuse Hypertension Peripheral vascular disease  Past Surgical History:  Procedure Laterality Date   APPENDECTOMY        Social History:     Social History   Tobacco Use   Smoking status: Every Day   Smokeless  tobacco: Not on file  Substance Use Topics   Alcohol use: Yes    Comment: occasionally       Family History :    History reviewed. No pertinent family history.    Home Medications:   Prior to Admission medications   Medication Sig Start Date End Date Taking? Authorizing Provider  aspirin  EC 81 MG tablet Take 1 tablet (81 mg total) by mouth daily with breakfast. Swallow whole. 02/17/24  Yes Emokpae, Courage, MD  folic acid  (FOLVITE ) 1 MG tablet Take 1 tablet (1 mg total) by mouth daily. 03/01/24  Yes Glennon Sand, NP  ibuprofen  (ADVIL ) 200 MG tablet Take 400 mg by mouth every 6 (six) hours as needed for mild pain (pain score 1-3).   Yes [provider]  metoprolol  tartrate (LOPRESSOR ) 25 MG tablet Take 1 tablet (25 mg total) by mouth 2 (two) times daily. 03/01/24 03/01/25 Yes Glennon Sand, NP  Misc Natural Products (NEURIVA PO)  Take 1 capsule by mouth daily. For nerve pain   Yes [provider]  nicotine  (NICODERM CQ  - DOSED IN MG/24 HOURS) 21 mg/24hr patch Place 1 patch (21 mg total) onto the skin daily. 02/18/24   Pearlean Manus, MD  traZODone  (DESYREL ) 150 MG tablet Take 1 tablet (150 mg total) by mouth at bedtime. 03/01/24   Glennon Sand, NP     Allergies:    No Known Allergies   Physical Exam:   Vitals  Blood pressure (!) 171/93, pulse 85, temperature 98.8 F (37.1 C), temperature source Oral, resp. rate 20, height 5' 10 (1.778 m), weight 83 kg, SpO2 95%.   1. General Well-developed male, laying in bed, no apparent distress  2. Normal affect and insight, Not Suicidal or Homicidal, Awake Alert, Oriented X 3.  3. No F.N deficits, ALL C.Nerves Intact, Strength 5/5 all 4 extremities, Sensation intact all 4 extremities, Plantars down going.  4. Ears and Eyes appear Normal, Conjunctivae clear, PERRLA. Moist Oral Mucosa.  5. Supple Neck, No JVD, No cervical lymphadenopathy appriciated, No Carotid Bruits.  6. Symmetrical Chest wall movement, Good air  movement bilaterally, CTAB.  7. RRR, No Gallops, Rubs or Murmurs, No Parasternal Heave.  8. Positive Bowel Sounds, Abdomen Soft, No tenderness, No organomegaly appriciated,No rebound -guarding or rigidity.  9.  No Cyanosis, Normal Skin Turgor, No Skin Rash or Bruise.  10. Good muscle tone,  joints appear normal , no effusions, Normal ROM.     Data Review:    CBC Recent Labs  Lab 03/06/24 1458  WBC 12.8*  HGB 14.6  HCT 41.3  PLT 210  MCV 105.9*  MCH 37.4*  MCHC 35.4  RDW 13.4  LYMPHSABS 1.8  MONOABS 1.0  EOSABS 0.1  BASOSABS 0.1   ------------------------------------------------------------------------------------------------------------------  Chemistries  Recent Labs  Lab 03/06/24 1458  NA 135  K 3.6  CL 97*  CO2 24  GLUCOSE 94  BUN 6  CREATININE 0.85  CALCIUM  8.7*  AST 251*  ALT 197*  ALKPHOS 152*  BILITOT 1.4*   ------------------------------------------------------------------------------------------------------------------ estimated creatinine clearance is 102.6 mL/min (by C-G formula based on SCr of 0.85 mg/dL). ------------------------------------------------------------------------------------------------------------------ No results for input(s): TSH, T4TOTAL, T3FREE, THYROIDAB in the last 72 hours.  Invalid input(s): FREET3  Coagulation profile No results for input(s): INR, PROTIME in the last 168 hours. ------------------------------------------------------------------------------------------------------------------- Recent Labs    03/06/24 1458  DDIMER 1.45*   -------------------------------------------------------------------------------------------------------------------  Cardiac Enzymes No results for input(s): CKMB, TROPONINI, MYOGLOBIN in the last 168 hours.  Invalid input(s): CK ------------------------------------------------------------------------------------------------------------------    Component  Value Date/Time   BNP 308.0 (H) 02/13/2024 0819     ---------------------------------------------------------------------------------------------------------------  Urinalysis    Component Value Date/Time   COLORURINE AMBER (A) 02/13/2024 1745   APPEARANCEUR CLEAR 02/13/2024 1745   LABSPEC >1.046 (H) 02/13/2024 1745   PHURINE 5.0 02/13/2024 1745   GLUCOSEU NEGATIVE 02/13/2024 1745   HGBUR NEGATIVE 02/13/2024 1745   BILIRUBINUR NEGATIVE 02/13/2024 1745   KETONESUR 20 (A) 02/13/2024 1745   PROTEINUR NEGATIVE 02/13/2024 1745   NITRITE NEGATIVE 02/13/2024 1745   LEUKOCYTESUR NEGATIVE 02/13/2024 1745    ----------------------------------------------------------------------------------------------------------------   Imaging Results:    CT Angio Chest PE W and/or Wo Contrast Result Date: 03/06/2024 CLINICAL DATA:  Chest pain while driving this afternoon, short of breath, diaphoresis, lower extremity numbness EXAM: CT ANGIOGRAPHY CHEST WITH CONTRAST TECHNIQUE: Multidetector CT imaging of the chest was performed using the standard protocol during bolus administration of intravenous contrast. Multiplanar CT image reconstructions and MIPs  were obtained to evaluate the vascular anatomy. RADIATION DOSE REDUCTION: This exam was performed according to the departmental dose-optimization program which includes automated exposure control, adjustment of the mA and/or kV according to patient size and/or use of iterative reconstruction technique. CONTRAST:  75mL OMNIPAQUE  IOHEXOL  350 MG/ML SOLN COMPARISON:  02/13/2024 FINDINGS: Cardiovascular: This is a technically adequate evaluation of the pulmonary vasculature. No filling defects or pulmonary emboli. Stable cardiomegaly and left ventricular dilatation. No pericardial effusion. No evidence of thoracic aortic aneurysm or dissection. Atherosclerosis of the aorta and coronary vasculature. Mediastinum/Nodes: No enlarged mediastinal, hilar, or axillary lymph  nodes. Thyroid gland, trachea, and esophagus demonstrate no significant findings. Lungs/Pleura: Subpleural blebs within the bilateral apices. No acute airspace disease, effusion, or pneumothorax. The central airways are patent. Upper Abdomen: There is high density material within the gallbladder consistent with gallbladder sludge. Gallbladder wall is thickened, with pericholecystic fat stranding, consistent with acute cholecystitis. Please correlate with laboratory evaluation and physical exam findings. Musculoskeletal: No acute or destructive bony abnormalities. Reconstructed images demonstrate no additional findings. Review of the MIP images confirms the above findings. IMPRESSION: 1. Abnormal appearance of the gallbladder consistent with gallbladder sludge and acute cholecystitis. Please correlate with physical exam findings and laboratory evaluation. If further imaging is required, right upper quadrant ultrasound could be performed. 2. No evidence of pulmonary embolus. 3. Stable cardiomegaly and left ventricular dilatation. 4. Aortic Atherosclerosis (ICD10-I70.0). Coronary artery atherosclerosis. Electronically Signed   By: Ozell Daring M.D.   On: 03/06/2024 19:14   DG Chest Port 1 View Result Date: 03/06/2024 CLINICAL DATA:  Chest pain, diaphoresis, shortness of breath EXAM: PORTABLE CHEST 1 VIEW COMPARISON:  02/13/2024 FINDINGS: Single frontal view of the chest demonstrates an unremarkable cardiac silhouette. No acute airspace disease, effusion, or pneumothorax. No acute bony abnormalities. IMPRESSION: 1. No acute intrathoracic process. Electronically Signed   By: Ozell Daring M.D.   On: 03/06/2024 16:26    EKG: Vent. rate 67 BPM PR interval 132 ms QRS duration 114 ms QT/QTcB 426/450 ms P-R-T axes 46 77 -73 Normal sinus rhythm ST & T wave abnormality, consider inferolateral ischemia Abnormal ECG When compared with ECG of 13-Feb-2024 07:37,  Assessment & Plan:    Principal Problem:   Acute  cholecystitis Active Problems:   Tobacco use   Essential hypertension, benign   Aortoiliac occlusive disease (HCC)  Acute cholecystitis Transaminitis -Imaging significant for acute cholecystitis, LFTs are elevated more than baseline despite his abstinence from alcohol. - Will start on IV antibiotics -will keep n.p.o. after midnight and further general surgery recommendations -Continue with IV fluids -Continue with as needed pain medication - Did not appreciate much tenderness in right upper quadrant, so we will obtain right upper quadrant ultrasound, will hold on ordering MRCP pending further general surgery input   Chest pain - Nontypical, pleuritic, most likely in the setting of referred pain due to acute cholecystitis - No acute EKG changes, troponins negative x 2 - CTA chest negative for PE - Will obtain 2D echo and repeat troponins in a.m.  Tobacco use:  -Start on nicotine  patch   CAD -Coronary calcification on imaging, baseline LBBB B and EKG - Negative troponins - Hold aspirin  for possible need of surgery. - Will hold on statin given elevated LFTs. - Continue with metoprolol    PAD - Previous studies studies reveal symmetrically abnormal bilateral ankle-brachial indices consistent with at least moderate underlying peripheral arterial disease. -Plan was to see vascular surgery Dr. Melodye tomorrow   Alcohol abuse -  Patient reports alcohol abstinence since discharge  DVT Prophylaxis Heparin    AM Labs Ordered, also please review Full Orders  Family Communication: Admission, patients condition and plan of care including tests being ordered have been discussed with the patient and wife at bedside who indicate understanding and agree with the plan and Code Status.  Code Status full code  Likely DC to home  Consults called: General Surgery by ED  Admission status: Inpatient  Time spent in minutes : 70 minutes   Brayton Lye M.D on 03/06/2024 at 9:06  PM   [facility] - Office  [phone]

## 2024-03-06 NOTE — ED Provider Notes (Signed)
 [place] EMERGENCY DEPARTMENT AT [facility] Provider Note   CSN: [id number] Arrival date & time: 03/06/24  1435     History  Chief Complaint  Patient presents with   Chest Pain    Maxwell Rosales. is a 53 y.o. male with PMH as listed below who presents with central, nonradiating, severe chest pain that began this afternoon while driving. Was not exertional. No associated SOB, nausea/vomiting, abdominal pain. No recent new leg swelling, SOB, cough, flu-like sxs. Has numb sensation in his legs d/t recently diagnosed aortoiliac occlusive disease, PAD, hepatic steatosis. Pain lasted a couple of hours and resolved. He had a similar episode occur yesterday. No active chest pain and currently feels at his baseline.  He denies any abdominal pain, nausea/vomiting/diarrhea.  Denies any fever/chills.   Past Medical History:  Diagnosis Date   Shoulder dislocation        Home Medications Prior to Admission medications   Medication Sig Start Date End Date Taking? Authorizing Provider  aspirin  EC 81 MG tablet Take 1 tablet (81 mg total) by mouth daily with breakfast. Swallow whole. 02/17/24  Yes Emokpae, Courage, MD  folic acid  (FOLVITE ) 1 MG tablet Take 1 tablet (1 mg total) by mouth daily. 03/01/24  Yes Boswell, Chelsa, NP  ibuprofen  (ADVIL ) 200 MG tablet Take 400 mg by mouth every 6 (six) hours as needed for mild pain (pain score 1-3).   Yes [provider]  metoprolol  tartrate (LOPRESSOR ) 25 MG tablet Take 1 tablet (25 mg total) by mouth 2 (two) times daily. 03/01/24 03/01/25 Yes Glennon Sand, NP  Misc Natural Products (NEURIVA PO) Take 1 capsule by mouth daily. For nerve pain   Yes [provider]  nicotine  (NICODERM CQ  - DOSED IN MG/24 HOURS) 21 mg/24hr patch Place 1 patch (21 mg total) onto the skin daily. 02/18/24   Pearlean Manus, MD  traZODone  (DESYREL ) 150 MG tablet Take 1 tablet (150 mg total) by mouth at bedtime. 03/01/24   Boswell, Chelsa, NP       Allergies    Patient has no known allergies.    Review of Systems   Review of Systems A 10 point review of systems was performed and is negative unless otherwise reported in HPI.  Physical Exam Updated Vital Signs BP (!) 152/98   Pulse 90   Temp 98.5 F (36.9 C) (Oral)   Resp 20   Ht 5' 10 (1.778 m)   Wt 83 kg   SpO2 95%   BMI 26.26 kg/m  Physical Exam General: Normal appearing male, lying in bed.  HEENT: PERRLA, Sclera anicteric, MMM, trachea midline.  Cardiology: RRR, no murmurs/rubs/gallops.  Resp: Normal respiratory rate and effort. CTAB, no wheezes, rhonchi, crackles.  Abd: Soft, non-tender, non-distended. No rebound tenderness or guarding.  GU: Deferred. MSK: No peripheral edema or signs of trauma. Extremities without deformity or TTP. No cyanosis or clubbing. Skin: warm, dry.  Neuro: A&Ox4, CNs II-XII grossly intact. MAEs. Sensation grossly intact.  Psych: Normal mood and affect.   ED Results / Procedures / Treatments   Labs (all labs ordered are listed, but only abnormal results are displayed) Labs Reviewed  CBC WITH DIFFERENTIAL/PLATELET - Abnormal; Notable for the following components:      Result Value   WBC 12.8 (*)    RBC 3.90 (*)    MCV 105.9 (*)    MCH 37.4 (*)    Neutro Abs 9.8 (*)    All other components within normal limits  COMPREHENSIVE METABOLIC PANEL WITH GFR - Abnormal; Notable for the following components:   Chloride 97 (*)    Calcium  8.7 (*)    Total Protein 6.2 (*)    Albumin 3.0 (*)    AST 251 (*)    ALT 197 (*)    Alkaline Phosphatase 152 (*)    Total Bilirubin 1.4 (*)    All other components within normal limits  D-DIMER, QUANTITATIVE - Abnormal; Notable for the following components:   D-Dimer, Quant 1.45 (*)    All other components within normal limits  TROPONIN I (HIGH SENSITIVITY) - Abnormal; Notable for the following components:   Troponin I (High Sensitivity) 18 (*)    All other components within normal limits  TROPONIN  I (HIGH SENSITIVITY) - Abnormal; Notable for the following components:   Troponin I (High Sensitivity) 19 (*)    All other components within normal limits  COMPREHENSIVE METABOLIC PANEL WITH GFR  CBC    EKG EKG Interpretation Date/Time:  Wednesday March 06 2024 14:45:43 EDT Ventricular Rate:  67 PR Interval:  132 QRS Duration:  114 QT Interval:  426 QTC Calculation: 450 R Axis:   77  Text Interpretation: Normal sinus rhythm Incomplete LBBB ST & T wave abnormality, consider inferolateral ischemia Confirmed by Maxwell Gills [id number]) on 03/06/2024 3:55:26 PM  Radiology CT Angio Chest PE W and/or Wo Contrast Result Date: 03/06/2024 CLINICAL DATA:  Chest pain while driving this afternoon, short of breath, diaphoresis, lower extremity numbness EXAM: CT ANGIOGRAPHY CHEST WITH CONTRAST TECHNIQUE: Multidetector CT imaging of the chest was performed using the standard protocol during bolus administration of intravenous contrast. Multiplanar CT image reconstructions and MIPs were obtained to evaluate the vascular anatomy. RADIATION DOSE REDUCTION: This exam was performed according to the departmental dose-optimization program which includes automated exposure control, adjustment of the mA and/or kV according to patient size and/or use of iterative reconstruction technique. CONTRAST:  75mL OMNIPAQUE  IOHEXOL  350 MG/ML SOLN COMPARISON:  02/13/2024 FINDINGS: Cardiovascular: This is a technically adequate evaluation of the pulmonary vasculature. No filling defects or pulmonary emboli. Stable cardiomegaly and left ventricular dilatation. No pericardial effusion. No evidence of thoracic aortic aneurysm or dissection. Atherosclerosis of the aorta and coronary vasculature. Mediastinum/Nodes: No enlarged mediastinal, hilar, or axillary lymph nodes. Thyroid gland, trachea, and esophagus demonstrate no significant findings. Lungs/Pleura: Subpleural blebs within the bilateral apices. No acute airspace disease,  effusion, or pneumothorax. The central airways are patent. Upper Abdomen: There is high density material within the gallbladder consistent with gallbladder sludge. Gallbladder wall is thickened, with pericholecystic fat stranding, consistent with acute cholecystitis. Please correlate with laboratory evaluation and physical exam findings. Musculoskeletal: No acute or destructive bony abnormalities. Reconstructed images demonstrate no additional findings. Review of the MIP images confirms the above findings. IMPRESSION: 1. Abnormal appearance of the gallbladder consistent with gallbladder sludge and acute cholecystitis. Please correlate with physical exam findings and laboratory evaluation. If further imaging is required, right upper quadrant ultrasound could be performed. 2. No evidence of pulmonary embolus. 3. Stable cardiomegaly and left ventricular dilatation. 4. Aortic Atherosclerosis (ICD10-I70.0). Coronary artery atherosclerosis. Electronically Signed   By: Ozell Daring M.D.   On: 03/06/2024 19:14   DG Chest Port 1 View Result Date: 03/06/2024 CLINICAL DATA:  Chest pain, diaphoresis, shortness of breath EXAM: PORTABLE CHEST 1 VIEW COMPARISON:  02/13/2024 FINDINGS: Single frontal view of the chest demonstrates an unremarkable cardiac silhouette. No acute airspace disease, effusion, or pneumothorax. No acute bony abnormalities. IMPRESSION: 1. No acute intrathoracic process.  Electronically Signed   By: Ozell Daring M.D.   On: 03/06/2024 16:26    Procedures Procedures    Medications Ordered in ED Medications  LORazepam  (ATIVAN ) injection 0-4 mg ( Intravenous See Alternative 03/06/24 2119)    Or  LORazepam  (ATIVAN ) tablet 0-4 mg (1 mg Oral Given 03/06/24 2119)  LORazepam  (ATIVAN ) injection 0-4 mg (has no administration in time range)    Or  LORazepam  (ATIVAN ) tablet 0-4 mg (has no administration in time range)  thiamine  (VITAMIN B1) tablet 100 mg (100 mg Oral Given 03/06/24 2036)    Or  thiamine   (VITAMIN B1) injection 100 mg ( Intravenous See Alternative 03/06/24 2036)  cefTRIAXone  (ROCEPHIN ) 2 g in sodium chloride  0.9 % 100 mL IVPB (0 g Intravenous Stopped 03/06/24 2125)  metroNIDAZOLE  (FLAGYL ) IVPB 500 mg (0 mg Intravenous Duplicate 03/06/24 2243)  nicotine  (NICODERM CQ  - dosed in mg/24 hours) patch 21 mg (has no administration in time range)  traZODone  (DESYREL ) tablet 150 mg (150 mg Oral Patient Refused/Not Given 03/06/24 2113)  folic acid  (FOLVITE ) tablet 1 mg (has no administration in time range)  heparin  injection 5,000 Units (has no administration in time range)  oxyCODONE  (Oxy IR/ROXICODONE ) immediate release tablet 5 mg (has no administration in time range)  morphine  (PF) 2 MG/ML injection 2 mg (has no administration in time range)  multivitamin with minerals tablet 1 tablet (1 tablet Oral Given 03/06/24 2120)  albuterol  (PROVENTIL ) (2.5 MG/3ML) 0.083% nebulizer solution 2.5 mg (has no administration in time range)  metoprolol  tartrate (LOPRESSOR ) tablet 25 mg (25 mg Oral Given 03/06/24 2120)  hydrALAZINE  (APRESOLINE ) injection 10 mg (has no administration in time range)  melatonin tablet 6 mg (6 mg Oral Given 03/06/24 2300)  iohexol  (OMNIPAQUE ) 350 MG/ML injection 75 mL (75 mLs Intravenous Contrast Given 03/06/24 1834)    ED Course/ Medical Decision Making/ A&P                          Medical Decision Making Amount and/or Complexity of Data Reviewed Labs: ordered. Decision-making details documented in ED Course. Radiology: ordered. Decision-making details documented in ED Course.  Risk OTC drugs. Prescription drug management. Decision regarding hospitalization.    This patient presents to the ED for concern of chest pain, this involves an extensive number of treatment options, and is a complaint that carries with it a high risk of complications and morbidity.  I considered the following differential and admission for this acute, potentially life threatening condition.    MDM:    DDX for chest pain includes but is not limited to:  Consider ACS including stable or unstable angina.  Patient has no new signs of ischemia on his EKG and troponin is flat x 2.  He does have known abdominal aortic aneurysm but has no abdominal pain and his pain is actually in his chest, doubt acute aortic syndrome for him but will be evaluating with a CT chest as well.  He is currently asymptomatic, does not need nitroglycerin .  Patient does have a recent history of an admission and an elevated D-dimer to 1.45 and so ordered a CT PE which actually demonstrated a evidence of acute cholecystitis.  Patient does not have any fevers or chills and he denies abdominal pain or nausea and vomiting.  However he does have elevated LFTs compared to his prior LFTs.  He states he has been decreasing his alcohol intake and does have an elevated white blood cell count as well.  Consulted with Dr. Mavis who recommends possible MRCP and also likely cholecystectomy.  Patient has remained asymptomatic during his admission and reluctantly agrees to be admitted.  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   Clinical Course as of 03/07/24 0000  Wed Mar 06, 2024  1553 WBC(!): 12.8 Elevated leukocytosis [HN]  1742 D-Dimer, Quant(!): 1.45 Will order CT PE [HN]  1742 Troponin I (High Sensitivity)(!): 18 Awaiting repeat [HN]  1921 Troponin I (High Sensitivity)(!): 19 Flat 20-19 [HN]  1921 CT Angio Chest PE W and/or Wo Contrast 1. Abnormal appearance of the gallbladder consistent with gallbladder sludge and acute cholecystitis. Please correlate with physical exam findings and laboratory evaluation. If further imaging is required, right upper quadrant ultrasound could be performed. 2. No evidence of pulmonary embolus. 3. Stable cardiomegaly and left ventricular dilatation. 4. Aortic Atherosclerosis (ICD10-I70.0). Coronary  artery atherosclerosis.   [HN]    Clinical Course User Index [HN] Maxwell Sid SAILOR, MD    Labs: I Ordered, and personally interpreted labs.  The pertinent results include: Consistent above  Imaging Studies ordered: I ordered imaging studies including chest x-ray I independently visualized and interpreted imaging. I agree with the radiologist interpretation  Additional history obtained from review, wife at bedside.    Cardiac Monitoring: The patient was maintained on a cardiac monitor.  I personally viewed and interpreted the cardiac monitored which showed an underlying rhythm of: Normal sinus rhythm  Reevaluation: After the interventions noted above, I reevaluated the patient and found that they have :resolved  Social Determinants of Health:  lives independently  Disposition: Admit to medicine with surgery following  Co morbidities that complicate the patient evaluation  Past Medical History:  Diagnosis Date   Shoulder dislocation      Medicines Meds ordered this encounter  Medications   iohexol  (OMNIPAQUE ) 350 MG/ML injection 75 mL   OR Linked Order Group    LORazepam  (ATIVAN ) injection 0-4 mg     CIWA-AR < 5 =:   0 mg     CIWA-AR 5 -10 =:   1 mg     CIWA-AR 11 -15 =:   2 mg     CIWA-AR 16 -20 =:   3 mg     CIWA-AR 16 -20 =:   Recheck CIWA-AR in 1 hour; if > 20 notify MD     CIWA-AR > 20 =:   4 mg     CIWA-AR > 20 =:   Notify MD    LORazepam  (ATIVAN ) tablet 0-4 mg     CIWA-AR < 5 =:   0 mg     CIWA-AR 5 -10 =:   1 mg     CIWA-AR 11 -15 =:   2 mg     CIWA-AR 16 -20 =:   3 mg     CIWA-AR 16 -20 =:   Recheck CIWA-AR in 1 hour; if > 20 notify MD     CIWA-AR > 20 =:   4 mg     CIWA-AR > 20 =:   Notify MD   OR Linked Order Group    LORazepam  (ATIVAN ) injection 0-4 mg     CIWA-AR < 5 =:   0 mg     CIWA-AR 5 -10 =:   1 mg     CIWA-AR 11 -15 =:   2 mg     CIWA-AR 16 -20 =:   3 mg  CIWA-AR 16 -20 =:   Recheck CIWA-AR in 1 hour; if > 20 notify MD     CIWA-AR >  20 =:   4 mg     CIWA-AR > 20 =:   Notify MD    LORazepam  (ATIVAN ) tablet 0-4 mg     CIWA-AR < 5 =:   0 mg     CIWA-AR 5 -10 =:   1 mg     CIWA-AR 11 -15 =:   2 mg     CIWA-AR 16 -20 =:   3 mg     CIWA-AR 16 -20 =:   Recheck CIWA-AR in 1 hour; if > 20 notify MD     CIWA-AR > 20 =:   4 mg     CIWA-AR > 20 =:   Notify MD   OR Linked Order Group    thiamine  (VITAMIN B1) tablet 100 mg    thiamine  (VITAMIN B1) injection 100 mg   DISCONTD: nicotine  (NICODERM CQ  - dosed in mg/24 hours) patch 21 mg   cefTRIAXone  (ROCEPHIN ) 2 g in sodium chloride  0.9 % 100 mL IVPB    Antibiotic Indication::   Intra-abdominal   metroNIDAZOLE  (FLAGYL ) IVPB 500 mg    Antibiotic Indication::   Intra-abdominal Infection   DISCONTD: metoprolol  tartrate (LOPRESSOR ) tablet 25 mg   nicotine  (NICODERM CQ  - dosed in mg/24 hours) patch 21 mg   traZODone  (DESYREL ) tablet 150 mg   folic acid  (FOLVITE ) tablet 1 mg   heparin  injection 5,000 Units   oxyCODONE  (Oxy IR/ROXICODONE ) immediate release tablet 5 mg   morphine  (PF) 2 MG/ML injection 2 mg   multivitamin with minerals tablet 1 tablet   DISCONTD: hydrALAZINE  (APRESOLINE ) injection 5 mg   albuterol  (PROVENTIL ) (2.5 MG/3ML) 0.083% nebulizer solution 2.5 mg   metoprolol  tartrate (LOPRESSOR ) tablet 25 mg   hydrALAZINE  (APRESOLINE ) injection 10 mg   melatonin tablet 6 mg    I have reviewed the patients home medicines and have made adjustments as needed  Problem List / ED Course: Problem List Items Addressed This Visit       Digestive   * (Principal) Acute cholecystitis   Other Visit Diagnoses       Atypical chest pain    -  Primary                   This note was created using dictation software, which may contain spelling or grammatical errors.    Maxwell Sid SAILOR, MD 03/07/24 0002

## 2024-03-06 NOTE — ED Triage Notes (Signed)
 Pt arrived via POV c/o chest pain that began this afternoon while driving. Pt reports having a blocked aortic aneurysm, feeling SOB, feeling diaphoretic and a numb sensation in his legs.

## 2024-03-06 NOTE — Plan of Care (Signed)

## 2024-03-06 NOTE — ED Notes (Signed)
 Wife requesting something to be ordered to help with pt's anxiety. Reports pt recently stopped drinking 2 weeks ago after getting out of hospital. Denies taking anything for anxiety at home.

## 2024-03-06 NOTE — ED Notes (Signed)
Pt c.o a headache

## 2024-03-06 NOTE — ED Notes (Signed)
 ED Provider at bedside.

## 2024-03-07 ENCOUNTER — Telehealth (INDEPENDENT_AMBULATORY_CARE_PROVIDER_SITE_OTHER): Payer: Self-pay | Admitting: Gastroenterology

## 2024-03-07 ENCOUNTER — Encounter (HOSPITAL_COMMUNITY): Payer: Self-pay | Admitting: Internal Medicine

## 2024-03-07 ENCOUNTER — Other Ambulatory Visit (HOSPITAL_COMMUNITY): Payer: Self-pay | Admitting: *Deleted

## 2024-03-07 ENCOUNTER — Inpatient Hospital Stay (HOSPITAL_COMMUNITY)

## 2024-03-07 ENCOUNTER — Encounter: Admitting: Vascular Surgery

## 2024-03-07 ENCOUNTER — Ambulatory Visit: Payer: Self-pay | Admitting: Gastroenterology

## 2024-03-07 ENCOUNTER — Telehealth: Payer: Self-pay | Admitting: Family Medicine

## 2024-03-07 DIAGNOSIS — F1090 Alcohol use, unspecified, uncomplicated: Secondary | ICD-10-CM

## 2024-03-07 DIAGNOSIS — K819 Cholecystitis, unspecified: Secondary | ICD-10-CM

## 2024-03-07 DIAGNOSIS — R945 Abnormal results of liver function studies: Secondary | ICD-10-CM | POA: Diagnosis not present

## 2024-03-07 DIAGNOSIS — K8000 Calculus of gallbladder with acute cholecystitis without obstruction: Secondary | ICD-10-CM

## 2024-03-07 DIAGNOSIS — K700 Alcoholic fatty liver: Secondary | ICD-10-CM

## 2024-03-07 DIAGNOSIS — R079 Chest pain, unspecified: Secondary | ICD-10-CM | POA: Diagnosis not present

## 2024-03-07 DIAGNOSIS — K7010 Alcoholic hepatitis without ascites: Secondary | ICD-10-CM

## 2024-03-07 DIAGNOSIS — R7989 Other specified abnormal findings of blood chemistry: Secondary | ICD-10-CM

## 2024-03-07 LAB — ECHOCARDIOGRAM COMPLETE
Area-P 1/2: 2.69 cm2
Calc EF: 40 %
Height: 70 in
MV M vel: 4.05 m/s
MV Peak grad: 65.6 mmHg
Radius: 0.6 cm
S' Lateral: 5.3 cm
Single Plane A2C EF: 36.7 %
Single Plane A4C EF: 40.8 %
Weight: 2927.99 [oz_av]

## 2024-03-07 LAB — COMPREHENSIVE METABOLIC PANEL WITH GFR
ALT: 190 U/L — ABNORMAL HIGH (ref 0–44)
AST: 138 U/L — ABNORMAL HIGH (ref 15–41)
Albumin: 2.8 g/dL — ABNORMAL LOW (ref 3.5–5.0)
Alkaline Phosphatase: 137 U/L — ABNORMAL HIGH (ref 38–126)
Anion gap: 10 (ref 5–15)
BUN: 6 mg/dL (ref 6–20)
CO2: 26 mmol/L (ref 22–32)
Calcium: 8.3 mg/dL — ABNORMAL LOW (ref 8.9–10.3)
Chloride: 100 mmol/L (ref 98–111)
Creatinine, Ser: 0.63 mg/dL (ref 0.61–1.24)
GFR, Estimated: >60 mL/min (ref >60)
Glucose, Bld: 88 mg/dL (ref 70–99)
Potassium: 3.6 mmol/L (ref 3.5–5.1)
Sodium: 136 mmol/L (ref 135–145)
Total Bilirubin: 0.8 mg/dL (ref 0.0–1.2)
Total Protein: 5.5 g/dL — ABNORMAL LOW (ref 6.5–8.1)

## 2024-03-07 LAB — CBC
HCT: 37.4 % — ABNORMAL LOW (ref 39.0–52.0)
Hemoglobin: 12.6 g/dL — ABNORMAL LOW (ref 13.0–17.0)
MCH: 35.9 pg — ABNORMAL HIGH (ref 26.0–34.0)
MCHC: 33.7 g/dL (ref 30.0–36.0)
MCV: 106.6 fL — ABNORMAL HIGH (ref 80.0–100.0)
Platelets: 193 K/uL (ref 150–400)
RBC: 3.51 MIL/uL — ABNORMAL LOW (ref 4.22–5.81)
RDW: 13.3 % (ref 11.5–15.5)
WBC: 5 K/uL (ref 4.0–10.5)
nRBC: 0 % (ref 0.0–0.2)

## 2024-03-07 LAB — HEPATITIS PANEL, ACUTE
HCV Ab: NONREACTIVE
Hep A IgM: NONREACTIVE
Hep B C IgM: NONREACTIVE
Hepatitis B Surface Ag: NONREACTIVE

## 2024-03-07 LAB — TROPONIN I (HIGH SENSITIVITY): Troponin I (High Sensitivity): 18 ng/L — ABNORMAL HIGH (ref <18)

## 2024-03-07 LAB — MAGNESIUM: Magnesium: 1.7 mg/dL (ref 1.7–2.4)

## 2024-03-07 MED ORDER — AMOXICILLIN-POT CLAVULANATE 875-125 MG PO TABS
1.0000 | ORAL_TABLET | Freq: Two times a day (BID) | ORAL | 0 refills | Status: DC
Start: 2024-03-07 — End: 2024-03-12

## 2024-03-07 NOTE — Consult Note (Signed)
 Gastroenterology Consult   Referring Provider: Dr. Oneil Budge Primary Care Physician:  Glennon Sand, NP Primary Gastroenterologist:  unassigned  Patient ID: Maxwell Rosales.; [id number]; 1971/06/25   Admit date: 03/06/2024  LOS: 1 day   Date of Consultation: 03/07/2024  Reason for Consultation:  transaminitis    History of Present Illness   Maxwell Rosales. is a 53 y.o. male with history of alcohol abuse, tobacco abuse, NSVT, left bundle branch block, aortoiliac occlusive disease, hypertension, thrombocytopenia, hepatic steatosis, infrarenal abdominal aortic aneurysm, chronic pain, recent admission for alcohol abuse/alcoholic hepatitis (discharged on February 17, 2024) presenting to the ED with complaints of chest pain, shortness of breath, diaphoresis.  ED course:  Systolic blood pressures in the 150-180 range, O2 sats 97% on room air, heart rate in the 80s.  Troponin 18-->19-->18 (3 weeks prior was 43), white blood cell count 12.8, hemoglobin 14.6, MCV 105.9, platelets 210, BUN 6, creatinine 0.85.  D-dimer 1.45 elevated. See LFTs below.  CTA chest negative for PE but concerning for acute cholecystitis.     Today: WBC down to 5.0. Hemoglobin 12.6. Completed right upper quadrant ultrasound showing abnormal gallbladder wall thickening of 5 mm, negative for Murphy sign, tumefactive sludge fills most of the gallbladder.  Trace pericholecystic fluid along the anterior wall of the gallbladder.  Common bile duct 0.3 cm.  Diffuse hepatic steatosis.  Portal vein patent on color Doppler imaging with normal directional blood flow towards the liver.  LFT TRENDS:  Component     Latest Ref Rng 02/13/2024 02/14/2024 02/15/2024 02/16/2024 03/06/2024  AST     15 - 41 U/L 171 (H)  183 (H)  132 (H)  100 (H)  251 (H)   ALT     0 - 44 U/L 108 (H)  97 (H)  85 (H)  81 (H)  197 (H)   Alkaline Phosphatase     38 - 126 U/L 128 (H)  130 (H)  125  124  152 (H)   Total Bilirubin     0.0 - 1.2  mg/dL 3.1 (H)  4.0 (H)  2.7 (H)  1.6 (H)  1.4 (H)    Component     Latest Ref Rng 03/07/2024  AST     15 - 41 U/L 138 (H)   ALT     0 - 44 U/L 190 (H)   Alkaline Phosphatase     38 - 126 U/L 137 (H)   Total Bilirubin     0.0 - 1.2 mg/dL 0.8       GI consult: Patient reports complete abstinence from alcohol since his discharge.  States he was feeling better.  Tuesday he ate breakfast which included some greasy foods such as hashbrowns.  3 hours later he developed chest pain associated with diaphoresis and shortness of breath.  He laid down to try to get relief woke up later symptoms resolved.  This occurred again the following day again 3 hours after breakfast, similar type foods.  Presented to the ED for further evaluation.  They were concerned it was related to his known vascular disease.  While in the ED his symptoms resolved.  Denies abdominal pain.  No heartburn.  Bowel movements have been normal.  No blood in the stool or melena.  Denies exertional chest pain. No leg swelling.  He takes an aspirin  daily.  Just recently started ibuprofen  600 mg twice daily.  Denies Tylenol  use.  Has been taking in Nervive (alpha lipoic acid  and B complex some formulations contain Tumeric) to help with his leg pain.   Prior to Admission medications   Medication Sig Start Date End Date Taking? Authorizing Provider  aspirin  EC 81 MG tablet Take 1 tablet (81 mg total) by mouth daily with breakfast. Swallow whole. 02/17/24  Yes Emokpae, Courage, MD  folic acid  (FOLVITE ) 1 MG tablet Take 1 tablet (1 mg total) by mouth daily. 03/01/24  Yes Boswell, Chelsa, NP  ibuprofen  (ADVIL ) 200 MG tablet Take 400 mg by mouth every 6 (six) hours as needed for mild pain (pain score 1-3).   Yes [provider]  metoprolol  tartrate (LOPRESSOR ) 25 MG tablet Take 1 tablet (25 mg total) by mouth 2 (two) times daily. 03/01/24 03/01/25 Yes Glennon Sand, NP  [place] For nerve pain   Yes [provider]  nicotine   (NICODERM CQ  - DOSED IN MG/24 HOURS) 21 mg/24hr patch Place 1 patch (21 mg total) onto the skin daily. 02/18/24   Pearlean Manus, MD  traZODone  (DESYREL ) 150 MG tablet Take 1 tablet (150 mg total) by mouth at bedtime. 03/01/24   Glennon Sand, NP    Current Facility-Administered Medications  Medication Dose Route Frequency Provider Last Rate Last Admin   albuterol  (PROVENTIL ) (2.5 MG/3ML) 0.083% nebulizer solution 2.5 mg  2.5 mg Nebulization Q2H PRN Elgergawy, Dawood S, MD       cefTRIAXone  (ROCEPHIN ) 2 g in sodium chloride  0.9 % 100 mL IVPB  2 g Intravenous Q24H Elgergawy, Dawood S, MD   Stopped at 03/06/24 2125   folic acid  (FOLVITE ) tablet 1 mg  1 mg Oral Daily Elgergawy, Dawood S, MD   1 mg at 03/07/24 0941   heparin  injection 5,000 Units  5,000 Units Subcutaneous Q8H Elgergawy, Dawood S, MD       hydrALAZINE  (APRESOLINE ) injection 10 mg  10 mg Intravenous Q4H PRN Elgergawy, Dawood S, MD       LORazepam  (ATIVAN ) injection 0-4 mg  0-4 mg Intravenous Q6H Franklyn Sid SAILOR, MD       Or   LORazepam  (ATIVAN ) tablet 0-4 mg  0-4 mg Oral Q6H Franklyn Sid SAILOR, MD   1 mg at 03/06/24 2119   [START ON 03/09/2024] LORazepam  (ATIVAN ) injection 0-4 mg  0-4 mg Intravenous Q12H Franklyn Sid SAILOR, MD       Or   [START ON 03/09/2024] LORazepam  (ATIVAN ) tablet 0-4 mg  0-4 mg Oral Q12H Franklyn Sid SAILOR, MD       melatonin tablet 6 mg  6 mg Oral QHS PRN Segars, Jonathan, MD   6 mg at 03/06/24 2300   metoprolol  tartrate (LOPRESSOR ) tablet 25 mg  25 mg Oral BID Elgergawy, Dawood S, MD   25 mg at 03/07/24 0941   metroNIDAZOLE  (FLAGYL ) IVPB 500 mg  500 mg Intravenous Q12H Elgergawy, Dawood S, MD 100 mL/hr at 03/07/24 0945 500 mg at 03/07/24 0945   morphine  (PF) 2 MG/ML injection 2 mg  2 mg Intravenous Q2H PRN Elgergawy, Dawood S, MD       multivitamin with minerals tablet 1 tablet  1 tablet Oral Daily Elgergawy, Dawood S, MD   1 tablet at 03/07/24 0941   nicotine  (NICODERM CQ  - dosed in mg/24 hours) patch 21 mg  21 mg  Transdermal Daily Elgergawy, Dawood S, MD   21 mg at 03/07/24 9057   oxyCODONE  (Oxy IR/ROXICODONE ) immediate release tablet 5 mg  5 mg Oral Q4H PRN Elgergawy, Dawood S, MD       thiamine  (VITAMIN B1)  tablet 100 mg  100 mg Oral Daily Franklyn Sid SAILOR, MD   100 mg at 03/07/24 0941   Or   thiamine  (VITAMIN B1) injection 100 mg  100 mg Intravenous Daily Franklyn Sid SAILOR, MD       traZODone  (DESYREL ) tablet 150 mg  150 mg Oral QHS Elgergawy, Brayton RAMAN, MD        Allergies as of 03/06/2024   (No Known Allergies)    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   Shoulder dislocation     Past Surgical History:  Procedure Laterality Date   APPENDECTOMY      Family History  Problem Relation Age of Onset   Colon cancer Neg Hx    Cirrhosis Neg Hx     Social History   Socioeconomic History   Marital status: Married    Spouse name: Not on file   Number of children: Not on file   Years of education: Not on file   Highest education level: Not on file  Occupational History   Not on file  Tobacco Use   Smoking status: Every Day   Smokeless tobacco: Not on file  Vaping Use   Vaping status: Never Used  Substance and Sexual Activity   Alcohol use: Not Currently    Comment: patient reports no etoh since 01/2024 admission for alcoholic hepatitis/withdrawal   Drug use: No   Sexual activity: Yes  Other Topics Concern   Not on file  Social History Narrative   Not on file   Social Drivers of Health   Financial Resource Strain: Not on file  Food Insecurity: No Food Insecurity (03/06/2024)   Hunger Vital Sign    Worried About Running Out of Food in the Last Year: Never true    Ran Out of Food in the Last Year: Never true  Transportation Needs: No Transportation Needs (03/06/2024)   PRAPARE - Administrator, Civil Service (Medical): No    Lack of Transportation (Non-Medical): No  Physical Activity: Not on file  Stress: Not on file  Social  Connections: Moderately Integrated (03/06/2024)   Social Connection and Isolation Panel    Frequency of Communication with Friends and Family: More than three times a week    Frequency of Social Gatherings with Friends and Family: Three times a week    Attends Religious Services: Never    Active Member of Clubs or Organizations: No    Attends Banker Meetings: 1 to 4 times per year    Marital Status: Married  Catering manager Violence: Not At Risk (03/06/2024)   Humiliation, Afraid, Rape, and Kick questionnaire    Fear of Current or Ex-Partner: No    Emotionally Abused: No    Physically Abused: No    Sexually Abused: No     Review of System:   General: Negative for anorexia, weight loss, fever, chills, fatigue, weakness. Eyes: Negative for vision changes.  ENT: Negative for hoarseness, difficulty swallowing , nasal congestion. CV: Negative for chest pain, angina, palpitations, dyspnea on exertion, peripheral edema. See hpi Respiratory: Negative for dyspnea at rest, dyspnea on exertion, cough, sputum, wheezing. See hpi GI: See history of present illness. GU:  Negative for dysuria, hematuria, urinary incontinence, urinary frequency, nocturnal urination.  MS: chronic leg pain.  Derm: Negative for rash or itching.  Neuro: Negative for weakness, abnormal sensation, seizure, frequent headaches, memory loss, confusion.  Psych: Negative for anxiety, depression, suicidal  ideation, hallucinations.  Endo: Negative for unusual weight change.  Heme: Negative for bruising or bleeding. Allergy: Negative for rash or hives.      Physical Examination:   Vital signs in last 24 hours: Temp:  [98 F (36.7 C)-98.8 F (37.1 C)] 98 F (36.7 C) (09/11 0433) Pulse Rate:  [76-93] 82 (09/11 0433) Resp:  [16-26] 20 (09/10 2100) BP: (135-194)/(78-113) 135/78 (09/11 0433) SpO2:  [95 %-99 %] 96 % (09/11 0433) Weight:  [83 kg] 83 kg (09/10 1443) Last BM Date : 03/06/24  General:  Well-nourished, well-developed in no acute distress.  Head: Normocephalic, atraumatic.   Eyes: Conjunctiva pink, no icterus. Mouth: Oropharyngeal mucosa moist and pink , no lesions erythema or exudate. Neck: Supple without thyromegaly, masses, or lymphadenopathy.  Lungs: Clear to auscultation bilaterally.  Heart: Regular rate and rhythm, no murmurs rubs or gallops.  Abdomen: Bowel sounds are normal, nontender, nondistended, no hepatosplenomegaly or masses, no abdominal bruits or hernia , no rebound. Some guarding in ruq patient reports he is ticklish. Noted only in ruq. Rectal: not performed Extremities: trace bilateral lower extremity edema, clubbing, deformity.  Neuro: Alert and oriented x 4 , grossly normal neurologically.  Skin: Warm and dry, no rash or jaundice.   Psych: Alert and cooperative, normal mood and affect.        Intake/Output from previous day: 09/10 0701 - 09/11 0700 In: 106.7 [IV Piggyback:106.7] Out: -  Intake/Output this shift: No intake/output data recorded.  Lab Results:   CBC Recent Labs    03/06/24 1458 03/07/24 0430  WBC 12.8* 5.0  HGB 14.6 12.6*  HCT 41.3 37.4*  MCV 105.9* 106.6*  PLT 210 193   BMET Recent Labs    03/06/24 1458 03/07/24 0430  NA 135 136  K 3.6 3.6  CL 97* 100  CO2 24 26  GLUCOSE 94 88  BUN 6 6  CREATININE 0.85 0.63  CALCIUM  8.7* 8.3*   LFT Recent Labs    03/06/24 1458 03/07/24 0430  BILITOT 1.4* 0.8  ALKPHOS 152* 137*  AST 251* 138*  ALT 197* 190*  PROT 6.2* 5.5*  ALBUMIN 3.0* 2.8*   No results found for: IRON, TIBC, FERRITIN Lab Results  Component Value Date   VITAMINB12 582 02/13/2024   No results found for: FOLATE  Lipase No results for input(s): LIPASE in the last 72 hours.  PT/INR No results for input(s): LABPROT, INR in the last 72 hours.   Hepatitis Panel No results for input(s): HEPBSAG, HCVAB, HEPAIGM, HEPBIGM in the last 72 hours.   Imaging Studies:   US  Abdomen  Limited RUQ (LIVER/GB) Result Date: 03/07/2024 CLINICAL DATA:  Acute cholecystitis EXAM: ULTRASOUND ABDOMEN LIMITED RIGHT UPPER QUADRANT COMPARISON:  02/13/2024 CT scan FINDINGS: Gallbladder: Abnormal gallbladder wall thickening 5 mm in single wall thickening. Sonographic Murphy's sign absent. Tumefactive sludge fills most of the gallbladder. Trace pericholecystic fluid along the anterior wall of the gallbladder. Common bile duct: Diameter: 0.3 cm Liver: No focal lesion identified. Coarse echogenic liver with poor sonic penetration compatible with diffuse hepatic steatosis. Portal vein is patent on color Doppler imaging with normal direction of blood flow towards the liver. Other: None. IMPRESSION: 1. Abnormal gallbladder wall thickening with tumefactive sludge filling most of the gallbladder. Trace pericholecystic fluid along the anterior wall of the gallbladder. Sonographic Murphy's sign absent. Findings are equivocal for acute cholecystitis. 2. Coarse echogenic liver with poor sonic penetration compatible with diffuse hepatic steatosis. Electronically Signed   By: Ryan Ramond HERO.D.  On: 03/07/2024 11:35   CT Angio Chest PE W and/or Wo Contrast Result Date: 03/06/2024 CLINICAL DATA:  Chest pain while driving this afternoon, short of breath, diaphoresis, lower extremity numbness EXAM: CT ANGIOGRAPHY CHEST WITH CONTRAST TECHNIQUE: Multidetector CT imaging of the chest was performed using the standard protocol during bolus administration of intravenous contrast. Multiplanar CT image reconstructions and MIPs were obtained to evaluate the vascular anatomy. RADIATION DOSE REDUCTION: This exam was performed according to the departmental dose-optimization program which includes automated exposure control, adjustment of the mA and/or kV according to patient size and/or use of iterative reconstruction technique. CONTRAST:  75mL OMNIPAQUE  IOHEXOL  350 MG/ML SOLN COMPARISON:  02/13/2024 FINDINGS: Cardiovascular: This  is a technically adequate evaluation of the pulmonary vasculature. No filling defects or pulmonary emboli. Stable cardiomegaly and left ventricular dilatation. No pericardial effusion. No evidence of thoracic aortic aneurysm or dissection. Atherosclerosis of the aorta and coronary vasculature. Mediastinum/Nodes: No enlarged mediastinal, hilar, or axillary lymph nodes. Thyroid gland, trachea, and esophagus demonstrate no significant findings. Lungs/Pleura: Subpleural blebs within the bilateral apices. No acute airspace disease, effusion, or pneumothorax. The central airways are patent. Upper Abdomen: There is high density material within the gallbladder consistent with gallbladder sludge. Gallbladder wall is thickened, with pericholecystic fat stranding, consistent with acute cholecystitis. Please correlate with laboratory evaluation and physical exam findings. Musculoskeletal: No acute or destructive bony abnormalities. Reconstructed images demonstrate no additional findings. Review of the MIP images confirms the above findings. IMPRESSION: 1. Abnormal appearance of the gallbladder consistent with gallbladder sludge and acute cholecystitis. Please correlate with physical exam findings and laboratory evaluation. If further imaging is required, right upper quadrant ultrasound could be performed. 2. No evidence of pulmonary embolus. 3. Stable cardiomegaly and left ventricular dilatation. 4. Aortic Atherosclerosis (ICD10-I70.0). Coronary artery atherosclerosis. Electronically Signed   By: Ozell Daring M.D.   On: 03/06/2024 19:14   DG Chest Port 1 View Result Date: 03/06/2024 CLINICAL DATA:  Chest pain, diaphoresis, shortness of breath EXAM: PORTABLE CHEST 1 VIEW COMPARISON:  02/13/2024 FINDINGS: Single frontal view of the chest demonstrates an unremarkable cardiac silhouette. No acute airspace disease, effusion, or pneumothorax. No acute bony abnormalities. IMPRESSION: 1. No acute intrathoracic process.  Electronically Signed   By: Ozell Daring M.D.   On: 03/06/2024 16:26   US  ARTERIAL ABI (SCREENING LOWER EXTREMITY) Result Date: 02/13/2024 CLINICAL DATA:  53 year old male with bilateral lower extremity pain and weakness EXAM: NONINVASIVE PHYSIOLOGIC VASCULAR STUDY OF BILATERAL LOWER EXTREMITIES TECHNIQUE: Evaluation of both lower extremities were performed at rest, including calculation of ankle-brachial indices with single level Doppler, pressure and pulse volume recording. COMPARISON:  CT scan of the abdomen and pelvis 02/13/2024 FINDINGS: Right ABI:  0.79 Left ABI:  0.75 Right Lower Extremity: Abnormal monophasic arterial waveform at the ankle. Left Lower Extremity: Abnormal monophasic arterial waveform at the ankle. 0.5-0.79 Moderate PAD IMPRESSION: 1. Symmetrically abnormal bilateral ankle-brachial indices consistent with at least moderate underlying peripheral arterial disease. 2. Correlation with prior CT imaging confirms aortoiliac occlusive disease. Electronically Signed   By: Wilkie Lent M.D.   On: 02/13/2024 11:08   CT Angio Chest PE W and/or Wo Contrast Result Date: 02/13/2024 EXAM: CTA CHEST AORTA 02/13/2024 09:10:22 AM TECHNIQUE: CTA of the chest was performed after the administration of intravenous contrast. Multiplanar reformatted images are provided for review. MIP images are provided for review. Automated exposure control, iterative reconstruction, and/or weight based adjustment of the mA/kV was utilized to reduce the radiation dose to as low as reasonably  achievable. COMPARISON: None available. CLINICAL HISTORY: Pulmonary embolism (PE) suspected, high prob. Pt c/o vomiting, generalized weakness, and lightheadedness x today, pt does endorse smoking 2 packs of cigarettes/day and ETOH use daily. Denies CP and shob at this time. FINDINGS: AORTA: No thoracic aortic dissection. No aneurysm. There is mild calcific atheromatous disease within the aortic arch. MEDIASTINUM: No mediastinal  lymphadenopathy. The heart is mildly enlarged. There is moderate calcific coronary artery disease present. LYMPH NODES: No mediastinal, hilar or axillary lymphadenopathy. LUNGS AND PLEURA: There are peripheral pulmonary blebs present. There are ground-glass opacities present dependently within the lower lobes bilaterally. No focal consolidation or pulmonary edema. No pleural effusion or pneumothorax. UPPER ABDOMEN: Limited images of the upper abdomen are unremarkable. SOFT TISSUES AND BONES: No acute bone or soft tissue abnormality. IMPRESSION: 1. No evidence of pulmonary embolism. 2. Mildly enlarged heart with moderate calcific coronary artery disease. 3. Mild calcific atheromatous disease within the aortic arch. Electronically signed by: Evalene Coho MD 02/13/2024 09:43 AM EDT RP Workstation: [id number]   CT ABDOMEN PELVIS W CONTRAST Result Date: 02/13/2024 EXAM: CT ABDOMEN AND PELVIS WITH CONTRAST 02/13/2024 09:10:22 AM TECHNIQUE: CT of the abdomen and pelvis was performed with the administration of intravenous contrast. Multiplanar reformatted images are provided for review. Automated exposure control, iterative reconstruction, and/or weight based adjustment of the mA/kV was utilized to reduce the radiation dose to as low as reasonably achievable. COMPARISON: None available. CLINICAL HISTORY: Nausea/vomiting, elevated LFTs. Pt c/o vomiting, generalized weakness, and lightheadedness x today, pt does endorse smoking 2 packs of cigarettes/day and ETOH use daily. Denies CP and shob at this time. FINDINGS: LOWER CHEST: No acute abnormality. LIVER: There is marked fatty infiltration of the liver. GALLBLADDER AND BILE DUCTS: There is radiopaque debris layering dependently within the gallbladder. No biliary ductal dilatation. SPLEEN: No acute abnormality. PANCREAS: No acute abnormality. ADRENAL GLANDS: No acute abnormality. KIDNEYS, URETERS AND BLADDER: No stones in the kidneys or ureters. No hydronephrosis. No  perinephric or periureteral stranding. Urinary bladder is unremarkable. GI AND BOWEL: There are numerous scattered colonic diverticula present. Stomach demonstrates no acute abnormality. There is no bowel obstruction. No bowel wall thickening. PERITONEUM AND RETROPERITONEUM: No ascites. No free air. VASCULATURE: There is mild fusiform aneurysmal dilatation of the abdominal aorta which measures up to 3.4 x 2.9 cm in cross-section. There is extensive calcific and noncalcific plaque present within the abdominal aorta. There is near occlusive stenosis of the aortic bifurcation and origins of the internal iliac arteries bilaterally. LYMPH NODES: No lymphadenopathy. REPRODUCTIVE ORGANS: No acute abnormality. BONES AND SOFT TISSUES: No acute osseous abnormality. No focal soft tissue abnormality. IMPRESSION: 1. 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 2. 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 3. Marked hepatic steatosis. 4. Radiopaque debris layering dependently within the gallbladder. Electronically signed by: Evalene Coho MD 02/13/2024 09:41 AM EDT RP Workstation: [id number]   CT Head Wo Contrast Result Date: 02/13/2024 EXAM: CT HEAD WITHOUT CONTRAST 02/13/2024 09:10:22 AM TECHNIQUE: CT of the head was performed without the administration of intravenous contrast. Automated exposure control, iterative reconstruction, and/or weight based adjustment of the mA/kV was utilized to reduce the radiation dose to as low as reasonably achievable. COMPARISON: None available. CLINICAL HISTORY: Visual changes, balance issues. Patient complains of vomiting, generalized weakness, and lightheadedness today. Patient endorses smoking 2 packs of cigarettes per day and alcohol  use daily. Denies chest pain and shortness of breath at this time. FINDINGS: BRAIN  AND VENTRICLES: No acute hemorrhage. Gray-white differentiation is preserved. No hydrocephalus. No extra-axial collection. No mass effect or midline shift. ORBITS: No acute abnormality. SINUSES: No acute abnormality. SOFT TISSUES AND SKULL: No acute soft tissue abnormality. No skull fracture. VASCULATURE: There are calcifications within the carotid siphons. IMPRESSION: 1. No acute intracranial abnormality. 2. Calcifications within the carotid siphons. Electronically signed by: Evalene Coho MD 02/13/2024 09:25 AM EDT RP Workstation: [id number]   DG Chest Portable 1 View Result Date: 02/13/2024 CLINICAL DATA:  SOB EXAM: DG CHEST 1V PORT COMPARISON:  None available. FINDINGS: No focal airspace consolidation, pleural effusion, or pneumothorax. No cardiomegaly. No acute fracture or destructive lesions. Multilevel thoracic osteophytosis. IMPRESSION: No acute cardiopulmonary abnormality. Electronically Signed   By: Rogelia Myers M.D.   On: 02/13/2024 08:24  [4 week]  Assessment:   53 y/o male with history of alcohol abuse, tobacco abuse, NSVT, left bundle branch block, aortoiliac occlusive disease, hypertension, thrombocytopenia, hepatic steatosis, infrarenal abdominal aortic aneurysm , no medical follow-up, chronic pain, recent admission for alcohol abuse/alcoholic hepatitis (discharged on February 17, 2024) presenting to the ED with complaints of chest pain, shortness of breath, diaphoresis. GI consulted for elevated LFTs per general surgery.  Elevated LFTs: -admission in 01/2024 with etoh abuse, elevated LFTs at that time suspected to be due to etoh hepatitis. GI did not consult. CT imaging at that time showed radiopaque debris in the gallbladder, no common bile duct dilation, marked fatty liver.  Alcohol level was 205.  Admitting total bilirubin was 3.1, alk phos 128, AST 171, ALT 108.  At time of discharge his total bilirubin was 1.6, alk phos 124, AST 100, ALT 81.  No other serologies obtained at that  time. -Reports complete abstinence since his admission 02/13/2024. -This admission: Tbili 1.4-->0.8, AP 152-->137, AST 251-->138, ALT 197-->190.  -he could have resolving etoh hepatitis with bump in numbers in the setting of inflamed gallbladder and potential movement of stones/debris. No evidence of CBD obstruction on imaging available with normal CBD diameter and improving LFTs. Discussed with Dr. Eartha, can hold off on MRCP. He describes symptoms which could be biliary in nature.  -given we do not have a baseline LFTs prior to recent hospitalization would recommend trend after discharge to see if normalize. If they do not, he will need further work up to rule out autoimmune processes, hemochromatosis, etc. We will check acute hepatitis panel while here.    Plan:   Acute hepatitis panel. Trend LFTs to normal. If they do not normalize, he will need further liver serologies. We will follow as an outpatient.  Complete etoh cessation. Recommend checking his Nervive supplement, if tumeric present, then discontinue.  Avoid hepatotoxic medications.  Follow with general surgery regarding gallbladder recommendations.     LOS: 1 day   We would like to thank you for the opportunity to participate in the care of Maxwell Rosales.SABRA  Maxwell Rosales. Maxwell Rosales [facility] [phone] 9/11/20252:32 PM

## 2024-03-07 NOTE — Progress Notes (Incomplete)
 PROGRESS NOTE    Maxwell Rosales.  FMW:[id number] DOB: July 23, 1970 DOA: 03/06/2024 PCP: Glennon Sand, NP   Brief Narrative: No notes on file   Assessment and Plan:  Acute cholecystitis ***  Chest pain ***  Tobacco use ***  CAD Chest pain this admission. Troponin negative. Baseline EKG. -Continue metoprolol  -Aspirin  held seconda  PAD Patient with symmetrically abnormal bilateral ABIs consistent with at least moderate disease. Patient to follow-up with vascular surgery as an outpatient.  History of alcohol abuse Noted. No alcohol level obtained on admission. Per patient, he has abstained from alcohol consumption for the last few weeks. CIWA ordered. -Continue CIWA   DVT prophylaxis: *** Code Status:   Code Status: Full Code Family Communication: *** Disposition Plan: ***   Consultants:  ***  Procedures:  ***  Antimicrobials: ***    Subjective: ***  Objective: BP 135/78 (BP Location: Left Arm)   Pulse 82   Temp 98 F (36.7 C) (Oral)   Resp 20   Ht 5' 10 (1.778 m)   Wt 83 kg   SpO2 96%   BMI 26.26 kg/m   Examination:  General exam: Appears calm and comfortable *** Respiratory system: Clear to auscultation. Respiratory effort normal. Cardiovascular system: S1 & S2 heard, RRR. No murmurs, rubs, gallops or clicks. Gastrointestinal system: Abdomen is nondistended, soft and nontender. No organomegaly or masses felt. Normal bowel sounds heard. Central nervous system: Alert and oriented. No focal neurological deficits. Musculoskeletal: No edema. No calf tenderness Skin: No cyanosis. No rashes Psychiatry: Judgement and insight appear normal. Mood & affect appropriate.    Data Reviewed: I have personally reviewed following labs and imaging studies  CBC Lab Results  Component Value Date   WBC 5.0 03/07/2024   RBC 3.51 (L) 03/07/2024   HGB 12.6 (L) 03/07/2024   HCT 37.4 (L) 03/07/2024   MCV 106.6 (H) 03/07/2024   MCH 35.9 (H)  03/07/2024   PLT 193 03/07/2024   MCHC 33.7 03/07/2024   RDW 13.3 03/07/2024   LYMPHSABS 1.8 03/06/2024   MONOABS 1.0 03/06/2024   EOSABS 0.1 03/06/2024   BASOSABS 0.1 03/06/2024     Last metabolic panel Lab Results  Component Value Date   NA 136 03/07/2024   K 3.6 03/07/2024   CL 100 03/07/2024   CO2 26 03/07/2024   BUN 6 03/07/2024   CREATININE 0.63 03/07/2024   GLUCOSE 88 03/07/2024   GFRNONAA >60 03/07/2024   CALCIUM  8.3 (L) 03/07/2024   PROT 5.5 (L) 03/07/2024   ALBUMIN 2.8 (L) 03/07/2024   BILITOT 0.8 03/07/2024   ALKPHOS 137 (H) 03/07/2024   AST 138 (H) 03/07/2024   ALT 190 (H) 03/07/2024   ANIONGAP 10 03/07/2024    GFR: Estimated Creatinine Clearance: 109 mL/min (by C-G formula based on SCr of 0.63 mg/dL).  No results found for this or any previous visit (from the past 240 hours).    Radiology Studies: CT Angio Chest PE W and/or Wo Contrast Result Date: 03/06/2024 CLINICAL DATA:  Chest pain while driving this afternoon, short of breath, diaphoresis, lower extremity numbness EXAM: CT ANGIOGRAPHY CHEST WITH CONTRAST TECHNIQUE: Multidetector CT imaging of the chest was performed using the standard protocol during bolus administration of intravenous contrast. Multiplanar CT image reconstructions and MIPs were obtained to evaluate the vascular anatomy. RADIATION DOSE REDUCTION: This exam was performed according to the departmental dose-optimization program which includes automated exposure control, adjustment of the mA and/or kV according to patient size and/or use of iterative  reconstruction technique. CONTRAST:  75mL OMNIPAQUE  IOHEXOL  350 MG/ML SOLN COMPARISON:  02/13/2024 FINDINGS: Cardiovascular: This is a technically adequate evaluation of the pulmonary vasculature. No filling defects or pulmonary emboli. Stable cardiomegaly and left ventricular dilatation. No pericardial effusion. No evidence of thoracic aortic aneurysm or dissection. Atherosclerosis of the aorta and  coronary vasculature. Mediastinum/Nodes: No enlarged mediastinal, hilar, or axillary lymph nodes. Thyroid gland, trachea, and esophagus demonstrate no significant findings. Lungs/Pleura: Subpleural blebs within the bilateral apices. No acute airspace disease, effusion, or pneumothorax. The central airways are patent. Upper Abdomen: There is high density material within the gallbladder consistent with gallbladder sludge. Gallbladder wall is thickened, with pericholecystic fat stranding, consistent with acute cholecystitis. Please correlate with laboratory evaluation and physical exam findings. Musculoskeletal: No acute or destructive bony abnormalities. Reconstructed images demonstrate no additional findings. Review of the MIP images confirms the above findings. IMPRESSION: 1. Abnormal appearance of the gallbladder consistent with gallbladder sludge and acute cholecystitis. Please correlate with physical exam findings and laboratory evaluation. If further imaging is required, right upper quadrant ultrasound could be performed. 2. No evidence of pulmonary embolus. 3. Stable cardiomegaly and left ventricular dilatation. 4. Aortic Atherosclerosis (ICD10-I70.0). Coronary artery atherosclerosis. Electronically Signed   By: Ozell Daring M.D.   On: 03/06/2024 19:14   DG Chest Port 1 View Result Date: 03/06/2024 CLINICAL DATA:  Chest pain, diaphoresis, shortness of breath EXAM: PORTABLE CHEST 1 VIEW COMPARISON:  02/13/2024 FINDINGS: Single frontal view of the chest demonstrates an unremarkable cardiac silhouette. No acute airspace disease, effusion, or pneumothorax. No acute bony abnormalities. IMPRESSION: 1. No acute intrathoracic process. Electronically Signed   By: Ozell Daring M.D.   On: 03/06/2024 16:26      LOS: 1 day    Elgin Lam, MD [facility] 03/07/2024, 7:44 AM   If 7PM-7AM, please contact night-coverage [email or web address]

## 2024-03-07 NOTE — Telephone Encounter (Signed)
 Hi Maxwell Rosales,  Can you please schedule a follow up appointment for this patient in 3-4  weeks with me or Sonny?  Thanks,  Toribio Fortune, MD Gastroenterology and Hepatology [facility]

## 2024-03-07 NOTE — TOC CM/SW Note (Signed)
 Transition of Care [facility]) - Inpatient Brief Assessment   Patient Details  Name: Maxwell Rosales. MRN: [id number] Date of Birth: 05-16-1971  Transition of Care [facility]) CM/SW Contact:    Noreen KATHEE Cleotilde ISRAEL Phone Number: 03/07/2024, 10:56 AM   Clinical Narrative:   Inpatient Care Management (ICM) has reviewed patient and no ICM needs have been identified at this time. We will continue to monitor patient advancement through interdisciplinary progression rounds. If new patient transition needs arise, please place a ICM consult.  Transition of Care Asessment: Insurance and Status: Insurance coverage has been reviewed Patient has primary care physician: Yes Home environment has been reviewed: Single Family Home Prior level of function:: Independent Prior/Current Home Services: No current home services Social Drivers of Health Review: SDOH reviewed no interventions necessary, SDOH reviewed interventions complete Readmission risk has been reviewed: Yes Transition of care needs: no transition of care needs at this time

## 2024-03-07 NOTE — Telephone Encounter (Signed)
 Called patient to discuss results of Transthoracic Echocardiogram cardiogram obtained while admitted. Transthoracic Echocardiogram significant for reduced LVEF of 35-40% with regional wall motion abnormality. Discussed with patient that I would be referring him to cardiology for an office visit as he will need ischemic cardiac workup for this finding. Patient was given return precautions. Patient voice understanding.  Maxwell Lam, MD [facility] 03/07/2024, 5:51 PM

## 2024-03-07 NOTE — Discharge Instructions (Addendum)
 Maxwell Rosales Maxwell Rosales.,  You were in the hospital with chest pain. Your workup did not appear to suggest a cardiac issue, but you were found to have gallbladder inflammation. Your liver numbers have improved since admission. The surgeon will defer your surgery, but this will have to be considered in the future. The gastroenterologist saw you and recommends close follow-up. Please follow-up with a PCP for general follow-up. Please return if you have recurrent symptoms. Please continue to abstain from alcohol.

## 2024-03-07 NOTE — Discharge Summary (Signed)
 Physician Discharge Summary   Patient: Maxwell Rosales. MRN: [id number] DOB: 17-Mar-1971  Admit date:     03/06/2024  Discharge date: 03/07/24  Discharge Physician: Elgin Lam   PCP: Glennon Sand, NP   Recommendations at discharge:  {Tip this will not be part of the note when signed- Example include specific recommendations for outpatient follow-up, pending tests to follow-up on. (Optional):26781}  ***  Discharge Diagnoses: Principal Problem:   Acute cholecystitis Active Problems:   Tobacco use   Essential hypertension, benign   Aortoiliac occlusive disease (HCC)  Resolved Problems:   * No resolved hospital problems. [facility] Course: No notes on file  Assessment and Plan:  Acute cholecystitis ***. General surgery consulted. LFTs improving and pain resolved. General surgery recommendation to defer surgical management at this time. Recommendation for antibiotics on discharge and outpatient follow-up.   Chest pain Present on admission. EKG non-ischemic.  Troponin elevated at 18 with negative delta of 19. Chest pain resolved. Possibly referred pain from acute cholecystitis.   Tobacco use ***   CAD Chest pain this admission. Troponin mildly elevated but not consistent with ACS. Baseline EKG. -Continue metoprolol  -Aspirin  held seconda   PAD Patient with symmetrically abnormal bilateral ABIs consistent with at least moderate disease. Patient to follow-up with vascular surgery as an outpatient.   History of alcohol abuse Noted. No alcohol level obtained on admission. Per patient, he has abstained from alcohol consumption for the last few weeks. CIWA ordered. -Continue CIWA   {Tip this will not be part of the note when signed Body mass index is 26.26 kg/m. , ,  (Optional):26781}  {(NOTE) Pain control PDMP Statment (Optional):26782} Consultants: *** Procedures performed: ***  Disposition: {Plan; Disposition:26390} Diet recommendation:   {Diet_Plan:26776} DISCHARGE MEDICATION: Allergies as of 03/07/2024   No Known Allergies   Med Rec must be completed prior to using this [facility]***       Follow-up Information     Mavis Anes, MD Follow up.   Specialty: General Surgery Why: If symptoms worsen Contact information: [address] [place] [facility] [address] [phone]                Discharge Exam: Filed Weights   03/06/24 1443  Weight: 83 kg   ***  Condition at discharge: {DC Condition:26389}  The results of significant diagnostics from this hospitalization (including imaging, microbiology, ancillary and laboratory) are listed below for reference.   Imaging Studies: US  Abdomen Limited RUQ (LIVER/GB) Result Date: 03/07/2024 CLINICAL DATA:  Acute cholecystitis EXAM: ULTRASOUND ABDOMEN LIMITED RIGHT UPPER QUADRANT COMPARISON:  02/13/2024 CT scan FINDINGS: Gallbladder: Abnormal gallbladder wall thickening 5 mm in single wall thickening. Sonographic Murphy's sign absent. Tumefactive sludge fills most of the gallbladder. Trace pericholecystic fluid along the anterior wall of the gallbladder. Common bile duct: Diameter: 0.3 cm Liver: No focal lesion identified. Coarse echogenic liver with poor sonic penetration compatible with diffuse hepatic steatosis. Portal vein is patent on color Doppler imaging with normal direction of blood flow towards the liver. Other: None. IMPRESSION: 1. Abnormal gallbladder wall thickening with tumefactive sludge filling most of the gallbladder. Trace pericholecystic fluid along the anterior wall of the gallbladder. Sonographic Murphy's sign absent. Findings are equivocal for acute cholecystitis. 2. Coarse echogenic liver with poor sonic penetration compatible with diffuse hepatic steatosis. Electronically Signed   By: Ryan Salvage M.D.   On: 03/07/2024 11:35   CT Angio Chest PE W and/or Wo Contrast Result Date: 03/06/2024 CLINICAL DATA:  Chest pain while driving this  afternoon, short of breath, diaphoresis, lower extremity numbness EXAM: CT ANGIOGRAPHY CHEST WITH CONTRAST TECHNIQUE: Multidetector CT imaging of the chest was performed using the standard protocol during bolus administration of intravenous contrast. Multiplanar CT image reconstructions and MIPs were obtained to evaluate the vascular anatomy. RADIATION DOSE REDUCTION: This exam was performed according to the departmental dose-optimization program which includes automated exposure control, adjustment of the mA and/or kV according to patient size and/or use of iterative reconstruction technique. CONTRAST:  75mL OMNIPAQUE  IOHEXOL  350 MG/ML SOLN COMPARISON:  02/13/2024 FINDINGS: Cardiovascular: This is a technically adequate evaluation of the pulmonary vasculature. No filling defects or pulmonary emboli. Stable cardiomegaly and left ventricular dilatation. No pericardial effusion. No evidence of thoracic aortic aneurysm or dissection. Atherosclerosis of the aorta and coronary vasculature. Mediastinum/Nodes: No enlarged mediastinal, hilar, or axillary lymph nodes. Thyroid gland, trachea, and esophagus demonstrate no significant findings. Lungs/Pleura: Subpleural blebs within the bilateral apices. No acute airspace disease, effusion, or pneumothorax. The central airways are patent. Upper Abdomen: There is high density material within the gallbladder consistent with gallbladder sludge. Gallbladder wall is thickened, with pericholecystic fat stranding, consistent with acute cholecystitis. Please correlate with laboratory evaluation and physical exam findings. Musculoskeletal: No acute or destructive bony abnormalities. Reconstructed images demonstrate no additional findings. Review of the MIP images confirms the above findings. IMPRESSION: 1. Abnormal appearance of the gallbladder consistent with gallbladder sludge and acute cholecystitis. Please correlate with physical exam findings and laboratory evaluation. If further  imaging is required, right upper quadrant ultrasound could be performed. 2. No evidence of pulmonary embolus. 3. Stable cardiomegaly and left ventricular dilatation. 4. Aortic Atherosclerosis (ICD10-I70.0). Coronary artery atherosclerosis. Electronically Signed   By: Ozell Daring M.D.   On: 03/06/2024 19:14   DG Chest Port 1 View Result Date: 03/06/2024 CLINICAL DATA:  Chest pain, diaphoresis, shortness of breath EXAM: PORTABLE CHEST 1 VIEW COMPARISON:  02/13/2024 FINDINGS: Single frontal view of the chest demonstrates an unremarkable cardiac silhouette. No acute airspace disease, effusion, or pneumothorax. No acute bony abnormalities. IMPRESSION: 1. No acute intrathoracic process. Electronically Signed   By: Ozell Daring M.D.   On: 03/06/2024 16:26   US  ARTERIAL ABI (SCREENING LOWER EXTREMITY) Result Date: 02/13/2024 CLINICAL DATA:  53 year old male with bilateral lower extremity pain and weakness EXAM: NONINVASIVE PHYSIOLOGIC VASCULAR STUDY OF BILATERAL LOWER EXTREMITIES TECHNIQUE: Evaluation of both lower extremities were performed at rest, including calculation of ankle-brachial indices with single level Doppler, pressure and pulse volume recording. COMPARISON:  CT scan of the abdomen and pelvis 02/13/2024 FINDINGS: Right ABI:  0.79 Left ABI:  0.75 Right Lower Extremity: Abnormal monophasic arterial waveform at the ankle. Left Lower Extremity: Abnormal monophasic arterial waveform at the ankle. 0.5-0.79 Moderate PAD IMPRESSION: 1. Symmetrically abnormal bilateral ankle-brachial indices consistent with at least moderate underlying peripheral arterial disease. 2. Correlation with prior CT imaging confirms aortoiliac occlusive disease. Electronically Signed   By: Wilkie Lent M.D.   On: 02/13/2024 11:08   CT Angio Chest PE W and/or Wo Contrast Result Date: 02/13/2024 EXAM: CTA CHEST AORTA 02/13/2024 09:10:22 AM TECHNIQUE: CTA of the chest was performed after the administration of intravenous  contrast. Multiplanar reformatted images are provided for review. MIP images are provided for review. Automated exposure control, iterative reconstruction, and/or weight based adjustment of the mA/kV was utilized to reduce the radiation dose to as low as reasonably achievable. COMPARISON: None available. CLINICAL HISTORY: Pulmonary embolism (PE) suspected, high prob. Pt c/o vomiting, generalized weakness, and lightheadedness x today, pt does endorse  smoking 2 packs of cigarettes/day and ETOH use daily. Denies CP and shob at this time. FINDINGS: AORTA: No thoracic aortic dissection. No aneurysm. There is mild calcific atheromatous disease within the aortic arch. MEDIASTINUM: No mediastinal lymphadenopathy. The heart is mildly enlarged. There is moderate calcific coronary artery disease present. LYMPH NODES: No mediastinal, hilar or axillary lymphadenopathy. LUNGS AND PLEURA: There are peripheral pulmonary blebs present. There are ground-glass opacities present dependently within the lower lobes bilaterally. No focal consolidation or pulmonary edema. No pleural effusion or pneumothorax. UPPER ABDOMEN: Limited images of the upper abdomen are unremarkable. SOFT TISSUES AND BONES: No acute bone or soft tissue abnormality. IMPRESSION: 1. No evidence of pulmonary embolism. 2. Mildly enlarged heart with moderate calcific coronary artery disease. 3. Mild calcific atheromatous disease within the aortic arch. Electronically signed by: Evalene Coho MD 02/13/2024 09:43 AM EDT RP Workstation: [id number]   CT ABDOMEN PELVIS W CONTRAST Result Date: 02/13/2024 EXAM: CT ABDOMEN AND PELVIS WITH CONTRAST 02/13/2024 09:10:22 AM TECHNIQUE: CT of the abdomen and pelvis was performed with the administration of intravenous contrast. Multiplanar reformatted images are provided for review. Automated exposure control, iterative reconstruction, and/or weight based adjustment of the mA/kV was utilized to reduce the radiation dose to as  low as reasonably achievable. COMPARISON: None available. CLINICAL HISTORY: Nausea/vomiting, elevated LFTs. Pt c/o vomiting, generalized weakness, and lightheadedness x today, pt does endorse smoking 2 packs of cigarettes/day and ETOH use daily. Denies CP and shob at this time. FINDINGS: LOWER CHEST: No acute abnormality. LIVER: There is marked fatty infiltration of the liver. GALLBLADDER AND BILE DUCTS: There is radiopaque debris layering dependently within the gallbladder. No biliary ductal dilatation. SPLEEN: No acute abnormality. PANCREAS: No acute abnormality. ADRENAL GLANDS: No acute abnormality. KIDNEYS, URETERS AND BLADDER: No stones in the kidneys or ureters. No hydronephrosis. No perinephric or periureteral stranding. Urinary bladder is unremarkable. GI AND BOWEL: There are numerous scattered colonic diverticula present. Stomach demonstrates no acute abnormality. There is no bowel obstruction. No bowel wall thickening. PERITONEUM AND RETROPERITONEUM: No ascites. No free air. VASCULATURE: There is mild fusiform aneurysmal dilatation of the abdominal aorta which measures up to 3.4 x 2.9 cm in cross-section. There is extensive calcific and noncalcific plaque present within the abdominal aorta. There is near occlusive stenosis of the aortic bifurcation and origins of the internal iliac arteries bilaterally. LYMPH NODES: No lymphadenopathy. REPRODUCTIVE ORGANS: No acute abnormality. BONES AND SOFT TISSUES: No acute osseous abnormality. No focal soft tissue abnormality. IMPRESSION: 1. 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 2. 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 3. Marked hepatic steatosis. 4. Radiopaque debris layering dependently within the gallbladder. Electronically signed by: Evalene Coho MD 02/13/2024 09:41 AM EDT RP  Workstation: [id number]   CT Head Wo Contrast Result Date: 02/13/2024 EXAM: CT HEAD WITHOUT CONTRAST 02/13/2024 09:10:22 AM TECHNIQUE: CT of the head was performed without the administration of intravenous contrast. Automated exposure control, iterative reconstruction, and/or weight based adjustment of the mA/kV was utilized to reduce the radiation dose to as low as reasonably achievable. COMPARISON: None available. CLINICAL HISTORY: Visual changes, balance issues. Patient complains of vomiting, generalized weakness, and lightheadedness today. Patient endorses smoking 2 packs of cigarettes per day and alcohol use daily. Denies chest pain and shortness of breath at this time. FINDINGS: BRAIN AND VENTRICLES: No acute hemorrhage. Gray-white differentiation is preserved. No  hydrocephalus. No extra-axial collection. No mass effect or midline shift. ORBITS: No acute abnormality. SINUSES: No acute abnormality. SOFT TISSUES AND SKULL: No acute soft tissue abnormality. No skull fracture. VASCULATURE: There are calcifications within the carotid siphons. IMPRESSION: 1. No acute intracranial abnormality. 2. Calcifications within the carotid siphons. Electronically signed by: Evalene Coho MD 02/13/2024 09:25 AM EDT RP Workstation: [id number]   DG Chest Portable 1 View Result Date: 02/13/2024 CLINICAL DATA:  SOB EXAM: DG CHEST 1V PORT COMPARISON:  None available. FINDINGS: No focal airspace consolidation, pleural effusion, or pneumothorax. No cardiomegaly. No acute fracture or destructive lesions. Multilevel thoracic osteophytosis. IMPRESSION: No acute cardiopulmonary abnormality. Electronically Signed   By: Rogelia Myers M.D.   On: 02/13/2024 08:24    Microbiology: No results found for this or any previous visit.  Labs: CBC: Recent Labs  Lab 03/06/24 1458 03/07/24 0430  WBC 12.8* 5.0  NEUTROABS 9.8*  --   HGB 14.6 12.6*  HCT 41.3 37.4*  MCV 105.9* 106.6*  PLT 210 193   Basic Metabolic  Panel: Recent Labs  Lab 03/06/24 1458 03/07/24 0430  NA 135 136  K 3.6 3.6  CL 97* 100  CO2 24 26  GLUCOSE 94 88  BUN 6 6  CREATININE 0.85 0.63  CALCIUM  8.7* 8.3*   Liver Function Tests: Recent Labs  Lab 03/06/24 1458 03/07/24 0430  AST 251* 138*  ALT 197* 190*  ALKPHOS 152* 137*  BILITOT 1.4* 0.8  PROT 6.2* 5.5*  ALBUMIN 3.0* 2.8*   CBG: No results for input(s): GLUCAP in the last 168 hours.  Discharge time spent: {LESS THAN/GREATER UYJW:73611} 30 minutes.  Signed: Elgin Lam, MD [facility] 03/07/2024

## 2024-03-07 NOTE — Consult Note (Signed)
 Reason for Consult:Acute Cholecystitis Referring Physician: Khaled Herda. is an 53 y.o. male.  HPI: Maxwell Rosales with a PMH of past medical history of alcohol abuse, tobacco abuse, CAD, hypertension, hyperlipidemia, PAD, with recent hospitalization secondary to alcohol withdrawals (discharged on 02/17/24, total abstinence from alcohol since discharge), presented to the ED yesterday with 2 days of central, nonradiating chest pain. Patient states that on Tuesday he had some nonexertional chest pain while driving that self-resolved so he thought it might've been due to a bad hashbrown from [employer]. On Wednesday the central nonradiating chest pain occurred again while driving so he presented to the ED after work. Also reports another incident last week. In the ED, EKG at baseline, troponin lower than his baseline x3, CTA chest negative for PE but concerning for acute cholecystitis. Elevated alk phos at 152, AST at 251, ALT at 197, total bili at 1.4 and has been abstinent from alcohol. Patient denies any associated abdominal pain, fevers, nausea/vomiting, constipation/diarrhea. Patient notes increase in urinary frequency but notes that it is likely a new side effect he's experiencing from trazodone  as it has resolved since stopping the medication. Patient is not on any blood thinners.   Past Medical History:  Diagnosis Date   Shoulder dislocation     Past Surgical History:  Procedure Laterality Date   APPENDECTOMY      History reviewed. No pertinent family history.  Social History:  reports that he has been smoking. He does not have any smokeless tobacco history on file. He reports current alcohol use. He reports that he does not use drugs.  Allergies: No Known Allergies  Medications: I have reviewed the patient's current medications.  Results for orders placed or performed during the hospital encounter of 03/06/24 (from the past 48 hours)  Troponin I (High Sensitivity)      Status: Abnormal   Collection Time: 03/06/24  2:58 PM  Result Value Ref Range   Troponin I (High Sensitivity) 18 (H) <18 ng/L    Comment: (NOTE) Elevated high sensitivity troponin I (hsTnI) values and significant  changes across serial measurements may suggest ACS but many other  chronic and acute conditions are known to elevate hsTnI results.  Refer to the Links section for chest pain algorithms and additional  guidance. Performed at [facility], [address], [place], [place] [address]   CBC with Differential     Status: Abnormal   Collection Time: 03/06/24  2:58 PM  Result Value Ref Range   WBC 12.8 (H) 4.0 - 10.5 K/uL   RBC 3.90 (L) 4.22 - 5.81 MIL/uL   Hemoglobin 14.6 13.0 - 17.0 g/dL   HCT 58.6 60.9 - 47.9 %   MCV 105.9 (H) 80.0 - 100.0 fL   MCH 37.4 (H) 26.0 - 34.0 pg   MCHC 35.4 30.0 - 36.0 g/dL   RDW 86.5 88.4 - 84.4 %   Platelets 210 150 - 400 K/uL   nRBC 0.0 0.0 - 0.2 %   Neutrophils Relative % 75 %   Neutro Abs 9.8 (H) 1.7 - 7.7 K/uL   Lymphocytes Relative 14 %   Lymphs Abs 1.8 0.7 - 4.0 K/uL   Monocytes Relative 8 %   Monocytes Absolute 1.0 0.1 - 1.0 K/uL   Eosinophils Relative 1 %   Eosinophils Absolute 0.1 0.0 - 0.5 K/uL   Basophils Relative 1 %   Basophils Absolute 0.1 0.0 - 0.1 K/uL   Immature Granulocytes 1 %   Abs Immature  Granulocytes 0.06 0.00 - 0.07 K/uL    Comment: Performed at [facility], [address] [place], [place] [address]  Comprehensive metabolic panel     Status: Abnormal   Collection Time: 03/06/24  2:58 PM  Result Value Ref Range   Sodium 135 135 - 145 mmol/L   Potassium 3.6 3.5 - 5.1 mmol/L   Chloride 97 (L) 98 - 111 mmol/L   CO2 24 22 - 32 mmol/L   Glucose, Bld 94 70 - 99 mg/dL    Comment: Glucose reference range applies only to samples taken after fasting for at least 8 hours.   BUN 6 6 - 20 mg/dL   Creatinine, Ser 9.14 0.61 - 1.24 mg/dL   Calcium  8.7 (L) 8.9 - 10.3 mg/dL   Total Protein 6.2 (L) 6.5 - 8.1 g/dL   Albumin  3.0 (L) 3.5 - 5.0 g/dL   AST 748 (H) 15 - 41 U/L   ALT 197 (H) 0 - 44 U/L   Alkaline Phosphatase 152 (H) 38 - 126 U/L   Total Bilirubin 1.4 (H) 0.0 - 1.2 mg/dL   GFR, Estimated >39 >39 mL/min    Comment: (NOTE) Calculated using the CKD-EPI Creatinine Equation (2021)    Anion gap 14 5 - 15    Comment: Performed at [facility], [address], [place], [place] [address]  D-dimer, quantitative     Status: Abnormal   Collection Time: 03/06/24  2:58 PM  Result Value Ref Range   D-Dimer, Quant 1.45 (H) 0.00 - 0.50 ug/mL-FEU    Comment: (NOTE) At the manufacturer cut-off value of 0.5 g/mL FEU, this assay has a negative predictive value of 95-100%.This assay is intended for use in conjunction with a clinical pretest probability (PTP) assessment model to exclude pulmonary embolism (PE) and deep venous thrombosis (DVT) in outpatients suspected of PE or DVT. Results should be correlated with clinical presentation. Performed at [facility], [address], [place], [place] [address]   Troponin I (High Sensitivity)     Status: Abnormal   Collection Time: 03/06/24  5:28 PM  Result Value Ref Range   Troponin I (High Sensitivity) 19 (H) <18 ng/L    Comment: (NOTE) Elevated high sensitivity troponin I (hsTnI) values and significant  changes across serial measurements may suggest ACS but many other  chronic and acute conditions are known to elevate hsTnI results.  Refer to the Links section for chest pain algorithms and additional  guidance. Performed at [facility], [address], [place], [place] [address]   Comprehensive metabolic panel     Status: Abnormal   Collection Time: 03/07/24  4:30 AM  Result Value Ref Range   Sodium 136 135 - 145 mmol/L   Potassium 3.6 3.5 - 5.1 mmol/L   Chloride 100 98 - 111 mmol/L   CO2 26 22 - 32 mmol/L   Glucose, Bld 88 70 - 99 mg/dL    Comment: Glucose reference range applies only to samples taken after fasting for at least 8 hours.   BUN 6 6 - 20  mg/dL   Creatinine, Ser 9.36 0.61 - 1.24 mg/dL   Calcium  8.3 (L) 8.9 - 10.3 mg/dL   Total Protein 5.5 (L) 6.5 - 8.1 g/dL   Albumin 2.8 (L) 3.5 - 5.0 g/dL   AST 861 (H) 15 - 41 U/L   ALT 190 (H) 0 - 44 U/L   Alkaline Phosphatase 137 (H) 38 - 126 U/L   Total Bilirubin 0.8 0.0 - 1.2 mg/dL  GFR, Estimated >60 >60 mL/min    Comment: (NOTE) Calculated using the CKD-EPI Creatinine Equation (2021)    Anion gap 10 5 - 15    Comment: Performed at [facility], [address], [place], [place] [address]  CBC     Status: Abnormal   Collection Time: 03/07/24  4:30 AM  Result Value Ref Range   WBC 5.0 4.0 - 10.5 K/uL   RBC 3.51 (L) 4.22 - 5.81 MIL/uL   Hemoglobin 12.6 (L) 13.0 - 17.0 g/dL   HCT 62.5 (L) 60.9 - 47.9 %   MCV 106.6 (H) 80.0 - 100.0 fL   MCH 35.9 (H) 26.0 - 34.0 pg   MCHC 33.7 30.0 - 36.0 g/dL   RDW 86.6 88.4 - 84.4 %   Platelets 193 150 - 400 K/uL   nRBC 0.0 0.0 - 0.2 %    Comment: Performed at [facility], [address], [place], [place] [address]  Troponin I (High Sensitivity)     Status: Abnormal   Collection Time: 03/07/24  4:37 AM  Result Value Ref Range   Troponin I (High Sensitivity) 18 (H) <18 ng/L    Comment: (NOTE) Elevated high sensitivity troponin I (hsTnI) values and significant  changes across serial measurements may suggest ACS but many other  chronic and acute conditions are known to elevate hsTnI results.  Refer to the Links section for chest pain algorithms and additional  guidance. Performed at [facility], [address] [place], [place] [address]     CT Angio Chest PE W and/or Wo Contrast Result Date: 03/06/2024 CLINICAL DATA:  Chest pain while driving this afternoon, short of breath, diaphoresis, lower extremity numbness EXAM: CT ANGIOGRAPHY CHEST WITH CONTRAST TECHNIQUE: Multidetector CT imaging of the chest was performed using the standard protocol during bolus administration of intravenous contrast. Multiplanar CT image reconstructions and MIPs  were obtained to evaluate the vascular anatomy. RADIATION DOSE REDUCTION: This exam was performed according to the departmental dose-optimization program which includes automated exposure control, adjustment of the mA and/or kV according to patient size and/or use of iterative reconstruction technique. CONTRAST:  75mL OMNIPAQUE  IOHEXOL  350 MG/ML SOLN COMPARISON:  02/13/2024 FINDINGS: Cardiovascular: This is a technically adequate evaluation of the pulmonary vasculature. No filling defects or pulmonary emboli. Stable cardiomegaly and left ventricular dilatation. No pericardial effusion. No evidence of thoracic aortic aneurysm or dissection. Atherosclerosis of the aorta and coronary vasculature. Mediastinum/Nodes: No enlarged mediastinal, hilar, or axillary lymph nodes. Thyroid gland, trachea, and esophagus demonstrate no significant findings. Lungs/Pleura: Subpleural blebs within the bilateral apices. No acute airspace disease, effusion, or pneumothorax. The central airways are patent. Upper Abdomen: There is high density material within the gallbladder consistent with gallbladder sludge. Gallbladder wall is thickened, with pericholecystic fat stranding, consistent with acute cholecystitis. Please correlate with laboratory evaluation and physical exam findings. Musculoskeletal: No acute or destructive bony abnormalities. Reconstructed images demonstrate no additional findings. Review of the MIP images confirms the above findings. IMPRESSION: 1. Abnormal appearance of the gallbladder consistent with gallbladder sludge and acute cholecystitis. Please correlate with physical exam findings and laboratory evaluation. If further imaging is required, right upper quadrant ultrasound could be performed. 2. No evidence of pulmonary embolus. 3. Stable cardiomegaly and left ventricular dilatation. 4. Aortic Atherosclerosis (ICD10-I70.0). Coronary artery atherosclerosis. Electronically Signed   By: Ozell Daring M.D.   On:  03/06/2024 19:14   DG Chest Port 1 View Result Date: 03/06/2024 CLINICAL DATA:  Chest pain, diaphoresis, shortness of breath EXAM: PORTABLE CHEST  1 VIEW COMPARISON:  02/13/2024 FINDINGS: Single frontal view of the chest demonstrates an unremarkable cardiac silhouette. No acute airspace disease, effusion, or pneumothorax. No acute bony abnormalities. IMPRESSION: 1. No acute intrathoracic process. Electronically Signed   By: Ozell Daring M.D.   On: 03/06/2024 16:26    ROS:  A comprehensive review of systems was negative.  Blood pressure 135/78, pulse 82, temperature 98 F (36.7 C), temperature source Oral, resp. rate 20, height 5' 10 (1.778 m), weight 83 kg, SpO2 96%.  Physical Exam Constitutional:      Appearance: He is well-developed.  Eyes:     Extraocular Movements: Extraocular movements intact.     Pupils: Pupils are equal, round, and reactive to light.  Cardiovascular:     Rate and Rhythm: Normal rate and regular rhythm.  Pulmonary:     Effort: Pulmonary effort is normal.     Breath sounds: Normal breath sounds.  Abdominal:     General: Abdomen is flat. There is no distension.     Palpations: Abdomen is soft. There is no mass.     Tenderness: There is no abdominal tenderness. There is no guarding or rebound.  Skin:    General: Skin is warm and dry.  Neurological:     Mental Status: He is alert.     Assessment/Plan: Acute Cholecystitis vs Biliary cholic Patient with history and imaging findings consistent with acute cholecystitis vs biliary cholic. Patient is currently asymptomatic and clinically improved. He did not have associated fever, RUQ pain, and leukocytosis is now resolved after antibiotics. Imaging showed acute cholecystitis, LFTs and ALP were elevated despite being abstinent from alcohol.  - Continue IV ceftriaxone  - Advance diet as tolerated as patient will not be urgently requiring surgery. May require surgery in the future if patient has repeated attacks.  -  RUQ US  pending - Consulted GI for transaminitis   Quinnetta Roepke 03/07/2024, 7:48 AM

## 2024-03-08 ENCOUNTER — Emergency Department (HOSPITAL_COMMUNITY)

## 2024-03-08 ENCOUNTER — Inpatient Hospital Stay (HOSPITAL_COMMUNITY)
Admission: EM | Admit: 2024-03-08 | Discharge: 2024-03-12 | DRG: 321 | Disposition: A | Discharge status: Home / Self Care | Attending: Internal Medicine | Admitting: Internal Medicine

## 2024-03-08 ENCOUNTER — Encounter: Payer: Self-pay | Admitting: Nurse Practitioner

## 2024-03-08 ENCOUNTER — Other Ambulatory Visit: Payer: Self-pay

## 2024-03-08 ENCOUNTER — Encounter (HOSPITAL_COMMUNITY): Payer: Self-pay | Admitting: Emergency Medicine

## 2024-03-08 DIAGNOSIS — F17210 Nicotine dependence, cigarettes, uncomplicated: Secondary | ICD-10-CM | POA: Diagnosis present

## 2024-03-08 DIAGNOSIS — I2582 Chronic total occlusion of coronary artery: Secondary | ICD-10-CM | POA: Diagnosis present

## 2024-03-08 DIAGNOSIS — I472 Ventricular tachycardia, unspecified: Secondary | ICD-10-CM | POA: Diagnosis not present

## 2024-03-08 DIAGNOSIS — R1010 Upper abdominal pain, unspecified: Principal | ICD-10-CM

## 2024-03-08 DIAGNOSIS — Z720 Tobacco use: Secondary | ICD-10-CM | POA: Diagnosis not present

## 2024-03-08 DIAGNOSIS — F1011 Alcohol abuse, in remission: Secondary | ICD-10-CM | POA: Diagnosis not present

## 2024-03-08 DIAGNOSIS — I10 Essential (primary) hypertension: Secondary | ICD-10-CM | POA: Diagnosis not present

## 2024-03-08 DIAGNOSIS — I5043 Acute on chronic combined systolic (congestive) and diastolic (congestive) heart failure: Secondary | ICD-10-CM | POA: Diagnosis present

## 2024-03-08 DIAGNOSIS — R7989 Other specified abnormal findings of blood chemistry: Secondary | ICD-10-CM | POA: Diagnosis not present

## 2024-03-08 DIAGNOSIS — Z7982 Long term (current) use of aspirin: Secondary | ICD-10-CM | POA: Diagnosis not present

## 2024-03-08 DIAGNOSIS — I110 Hypertensive heart disease with heart failure: Secondary | ICD-10-CM | POA: Diagnosis present

## 2024-03-08 DIAGNOSIS — I5023 Acute on chronic systolic (congestive) heart failure: Secondary | ICD-10-CM | POA: Diagnosis present

## 2024-03-08 DIAGNOSIS — I5041 Acute combined systolic (congestive) and diastolic (congestive) heart failure: Secondary | ICD-10-CM

## 2024-03-08 DIAGNOSIS — I5020 Unspecified systolic (congestive) heart failure: Secondary | ICD-10-CM | POA: Diagnosis not present

## 2024-03-08 DIAGNOSIS — Z955 Presence of coronary angioplasty implant and graft: Secondary | ICD-10-CM

## 2024-03-08 DIAGNOSIS — F1021 Alcohol dependence, in remission: Secondary | ICD-10-CM | POA: Diagnosis present

## 2024-03-08 DIAGNOSIS — E785 Hyperlipidemia, unspecified: Secondary | ICD-10-CM | POA: Diagnosis present

## 2024-03-08 DIAGNOSIS — I739 Peripheral vascular disease, unspecified: Secondary | ICD-10-CM | POA: Diagnosis present

## 2024-03-08 DIAGNOSIS — Z79899 Other long term (current) drug therapy: Secondary | ICD-10-CM | POA: Diagnosis not present

## 2024-03-08 DIAGNOSIS — I4729 Other ventricular tachycardia: Secondary | ICD-10-CM

## 2024-03-08 DIAGNOSIS — I7143 Infrarenal abdominal aortic aneurysm, without rupture: Secondary | ICD-10-CM | POA: Diagnosis present

## 2024-03-08 DIAGNOSIS — I429 Cardiomyopathy, unspecified: Secondary | ICD-10-CM

## 2024-03-08 DIAGNOSIS — K810 Acute cholecystitis: Secondary | ICD-10-CM | POA: Diagnosis not present

## 2024-03-08 DIAGNOSIS — Z79891 Long term (current) use of opiate analgesic: Secondary | ICD-10-CM

## 2024-03-08 DIAGNOSIS — I214 Non-ST elevation (NSTEMI) myocardial infarction: Secondary | ICD-10-CM | POA: Diagnosis not present

## 2024-03-08 DIAGNOSIS — K709 Alcoholic liver disease, unspecified: Secondary | ICD-10-CM | POA: Diagnosis present

## 2024-03-08 DIAGNOSIS — K760 Fatty (change of) liver, not elsewhere classified: Secondary | ICD-10-CM | POA: Diagnosis present

## 2024-03-08 DIAGNOSIS — F1010 Alcohol abuse, uncomplicated: Secondary | ICD-10-CM | POA: Diagnosis not present

## 2024-03-08 DIAGNOSIS — I509 Heart failure, unspecified: Secondary | ICD-10-CM | POA: Diagnosis not present

## 2024-03-08 DIAGNOSIS — Z716 Tobacco abuse counseling: Secondary | ICD-10-CM | POA: Diagnosis not present

## 2024-03-08 DIAGNOSIS — Z743 Need for continuous supervision: Secondary | ICD-10-CM | POA: Diagnosis not present

## 2024-03-08 DIAGNOSIS — I255 Ischemic cardiomyopathy: Secondary | ICD-10-CM | POA: Diagnosis present

## 2024-03-08 DIAGNOSIS — G4700 Insomnia, unspecified: Secondary | ICD-10-CM | POA: Diagnosis not present

## 2024-03-08 DIAGNOSIS — I2510 Atherosclerotic heart disease of native coronary artery without angina pectoris: Secondary | ICD-10-CM | POA: Diagnosis present

## 2024-03-08 DIAGNOSIS — K8018 Calculus of gallbladder with other cholecystitis without obstruction: Secondary | ICD-10-CM | POA: Diagnosis not present

## 2024-03-08 DIAGNOSIS — K8010 Calculus of gallbladder with chronic cholecystitis without obstruction: Secondary | ICD-10-CM | POA: Diagnosis present

## 2024-03-08 DIAGNOSIS — K700 Alcoholic fatty liver: Secondary | ICD-10-CM

## 2024-03-08 DIAGNOSIS — R0789 Other chest pain: Secondary | ICD-10-CM | POA: Diagnosis not present

## 2024-03-08 DIAGNOSIS — G8929 Other chronic pain: Secondary | ICD-10-CM | POA: Diagnosis present

## 2024-03-08 DIAGNOSIS — I7090 Unspecified atherosclerosis: Secondary | ICD-10-CM | POA: Diagnosis not present

## 2024-03-08 DIAGNOSIS — I25110 Atherosclerotic heart disease of native coronary artery with unstable angina pectoris: Secondary | ICD-10-CM | POA: Diagnosis not present

## 2024-03-08 LAB — COMPREHENSIVE METABOLIC PANEL WITH GFR
ALT: 244 U/L — ABNORMAL HIGH (ref 0–44)
AST: 251 U/L — ABNORMAL HIGH (ref 15–41)
Albumin: 2.9 g/dL — ABNORMAL LOW (ref 3.5–5.0)
Alkaline Phosphatase: 178 U/L — ABNORMAL HIGH (ref 38–126)
Anion gap: 9 (ref 5–15)
BUN: 8 mg/dL (ref 6–20)
CO2: 24 mmol/L (ref 22–32)
Calcium: 9.3 mg/dL (ref 8.9–10.3)
Chloride: 101 mmol/L (ref 98–111)
Creatinine, Ser: 0.62 mg/dL (ref 0.61–1.24)
GFR, Estimated: >60 mL/min (ref >60)
Glucose, Bld: 117 mg/dL — ABNORMAL HIGH (ref 70–99)
Potassium: 3.7 mmol/L (ref 3.5–5.1)
Sodium: 134 mmol/L — ABNORMAL LOW (ref 135–145)
Total Bilirubin: 1.4 mg/dL — ABNORMAL HIGH (ref 0.0–1.2)
Total Protein: 6.2 g/dL — ABNORMAL LOW (ref 6.5–8.1)

## 2024-03-08 LAB — CBC WITH DIFFERENTIAL/PLATELET
Abs Immature Granulocytes: 0.02 K/uL (ref 0.00–0.07)
Basophils Absolute: 0.1 K/uL (ref 0.0–0.1)
Basophils Relative: 1 %
Eosinophils Absolute: 0.1 K/uL (ref 0.0–0.5)
Eosinophils Relative: 1 %
HCT: 41.1 % (ref 39.0–52.0)
Hemoglobin: 14.2 g/dL (ref 13.0–17.0)
Immature Granulocytes: 0 %
Lymphocytes Relative: 13 %
Lymphs Abs: 1.3 K/uL (ref 0.7–4.0)
MCH: 35.9 pg — ABNORMAL HIGH (ref 26.0–34.0)
MCHC: 34.5 g/dL (ref 30.0–36.0)
MCV: 104.1 fL — ABNORMAL HIGH (ref 80.0–100.0)
Monocytes Absolute: 0.6 K/uL (ref 0.1–1.0)
Monocytes Relative: 6 %
Neutro Abs: 8.1 K/uL — ABNORMAL HIGH (ref 1.7–7.7)
Neutrophils Relative %: 79 %
Platelets: 183 K/uL (ref 150–400)
RBC: 3.95 MIL/uL — ABNORMAL LOW (ref 4.22–5.81)
RDW: 13.3 % (ref 11.5–15.5)
Smear Review: NORMAL
WBC: 10.2 K/uL (ref 4.0–10.5)
nRBC: 0 % (ref 0.0–0.2)

## 2024-03-08 LAB — ETHANOL: Alcohol, Ethyl (B): <15 mg/dL (ref <15)

## 2024-03-08 LAB — PROTIME-INR
INR: 0.9 (ref 0.8–1.2)
Prothrombin Time: 12.5 s (ref 11.4–15.2)

## 2024-03-08 LAB — LIPASE, BLOOD: Lipase: 114 U/L — ABNORMAL HIGH (ref 11–51)

## 2024-03-08 LAB — TROPONIN I (HIGH SENSITIVITY)
Troponin I (High Sensitivity): 18 ng/L — ABNORMAL HIGH (ref <18)
Troponin I (High Sensitivity): 18 ng/L — ABNORMAL HIGH (ref <18)

## 2024-03-08 MED ORDER — CARVEDILOL 3.125 MG PO TABS
3.1250 mg | ORAL_TABLET | Freq: Two times a day (BID) | ORAL | Status: DC
  Administered 2024-03-08 – 2024-03-12 (×8): 3.125 mg via ORAL
  Filled 2024-03-08 (×8): qty 1

## 2024-03-08 MED ORDER — NICOTINE 21 MG/24HR TD PT24
21.0000 mg | MEDICATED_PATCH | Freq: Once | TRANSDERMAL | Status: DC
  Administered 2024-03-08: 21 mg via TRANSDERMAL
  Filled 2024-03-08: qty 1

## 2024-03-08 MED ORDER — ASPIRIN 81 MG PO CHEW
81.0000 mg | CHEWABLE_TABLET | Freq: Once | ORAL | Status: DC
Start: 2024-03-08 — End: 2024-03-08

## 2024-03-08 MED ORDER — FUROSEMIDE 10 MG/ML IJ SOLN
40.0000 mg | Freq: Every day | INTRAMUSCULAR | Status: DC
  Administered 2024-03-08 – 2024-03-11 (×4): 40 mg via INTRAVENOUS
  Filled 2024-03-08 (×4): qty 4

## 2024-03-08 MED ORDER — AMOXICILLIN-POT CLAVULANATE 875-125 MG PO TABS
1.0000 | ORAL_TABLET | Freq: Two times a day (BID) | ORAL | Status: DC
  Administered 2024-03-08 – 2024-03-12 (×8): 1 via ORAL
  Filled 2024-03-08 (×9): qty 1

## 2024-03-08 MED ORDER — ROSUVASTATIN CALCIUM 20 MG PO TABS: 40.0000 mg | ORAL_TABLET | Freq: Every day | ORAL | Status: DC

## 2024-03-08 MED ORDER — ZOLPIDEM TARTRATE 5 MG PO TABS
10.0000 mg | ORAL_TABLET | Freq: Every evening | ORAL | Status: DC | PRN
  Administered 2024-03-08 – 2024-03-11 (×4): 10 mg via ORAL
  Filled 2024-03-08 (×4): qty 2

## 2024-03-08 MED ORDER — FOLIC ACID 1 MG PO TABS
1.0000 mg | ORAL_TABLET | Freq: Every day | ORAL | Status: DC
  Administered 2024-03-09 – 2024-03-12 (×4): 1 mg via ORAL
  Filled 2024-03-08 (×4): qty 1

## 2024-03-08 MED ORDER — ONDANSETRON HCL 4 MG/2ML IJ SOLN
4.0000 mg | Freq: Once | INTRAMUSCULAR | Status: AC
  Administered 2024-03-08: 4 mg via INTRAVENOUS
  Filled 2024-03-08: qty 2

## 2024-03-08 MED ORDER — TECHNETIUM TC 99M MEBROFENIN IV KIT
5.0000 | PACK | Freq: Once | INTRAVENOUS | Status: AC | PRN
  Administered 2024-03-08: 4.65 via INTRAVENOUS

## 2024-03-08 MED ORDER — SACUBITRIL-VALSARTAN 24-26 MG PO TABS
1.0000 | ORAL_TABLET | Freq: Two times a day (BID) | ORAL | Status: DC
  Administered 2024-03-08 – 2024-03-12 (×7): 1 via ORAL
  Filled 2024-03-08 (×8): qty 1

## 2024-03-08 MED ORDER — MORPHINE SULFATE (PF) 4 MG/ML IV SOLN: 4.0000 mg | Freq: Once | INTRAVENOUS | Status: DC

## 2024-03-08 MED ORDER — HEPARIN SODIUM (PORCINE) 5000 UNIT/ML IJ SOLN
5000.0000 [IU] | Freq: Three times a day (TID) | INTRAMUSCULAR | Status: DC
  Administered 2024-03-09: 5000 [IU] via SUBCUTANEOUS
  Filled 2024-03-08: qty 1

## 2024-03-08 MED ORDER — TRAZODONE HCL 50 MG PO TABS: 150.0000 mg | ORAL_TABLET | Freq: Every evening | ORAL | Status: DC | PRN

## 2024-03-08 MED ORDER — MORPHINE SULFATE (PF) 2 MG/ML IV SOLN
2.0000 mg | INTRAVENOUS | Status: DC | PRN
  Filled 2024-03-08: qty 1

## 2024-03-08 MED ORDER — SODIUM CHLORIDE 0.9 % IV BOLUS
500.0000 mL | Freq: Once | INTRAVENOUS | Status: AC
  Administered 2024-03-08: 500 mL via INTRAVENOUS

## 2024-03-08 MED ORDER — FENTANYL CITRATE (PF) 100 MCG/2ML IJ SOLN
50.0000 ug | Freq: Once | INTRAMUSCULAR | Status: AC
  Administered 2024-03-08: 50 ug via INTRAVENOUS
  Filled 2024-03-08: qty 2

## 2024-03-08 MED ORDER — NICOTINE 21 MG/24HR TD PT24
21.0000 mg | MEDICATED_PATCH | Freq: Every day | TRANSDERMAL | Status: DC
  Administered 2024-03-09 – 2024-03-12 (×4): 21 mg via TRANSDERMAL
  Filled 2024-03-08 (×4): qty 1

## 2024-03-08 MED ORDER — HYDRALAZINE HCL 20 MG/ML IJ SOLN: 5.0000 mg | INTRAMUSCULAR | Status: DC | PRN

## 2024-03-08 MED ORDER — THIAMINE MONONITRATE 100 MG PO TABS
100.0000 mg | ORAL_TABLET | Freq: Every day | ORAL | Status: DC
  Administered 2024-03-08 – 2024-03-12 (×5): 100 mg via ORAL
  Filled 2024-03-08 (×5): qty 1

## 2024-03-08 MED ORDER — ALBUTEROL SULFATE (2.5 MG/3ML) 0.083% IN NEBU: 2.5000 mg | INHALATION_SOLUTION | RESPIRATORY_TRACT | Status: DC | PRN

## 2024-03-08 MED ORDER — OXYCODONE HCL 5 MG PO TABS: 5.0000 mg | ORAL_TABLET | ORAL | Status: DC | PRN

## 2024-03-08 MED ORDER — ASPIRIN 81 MG PO TBEC
81.0000 mg | DELAYED_RELEASE_TABLET | Freq: Every day | ORAL | Status: DC
  Administered 2024-03-09 – 2024-03-12 (×4): 81 mg via ORAL
  Filled 2024-03-08 (×4): qty 1

## 2024-03-08 NOTE — ED Triage Notes (Signed)
 Pt in by pov. States he was d/c home yesterday from inpatient for cholecystitis and to have his galbladder removed. Pt states he has been in pain since 0200 this morning and has had no relief.

## 2024-03-08 NOTE — Consult Note (Addendum)
 CARDIOLOGY CONSULT NOTE    Patient ID: Maxwell Rosales.; [id number]; 1970-10-11   Admit date: 03/08/2024 Date of Consult: 03/08/2024  Primary Care Provider: Glennon Sand, NP Primary Cardiologist:  Primary Electrophysiologist:     History of Present Illness:   Maxwell Rosales is a 53 year old F known to have moderate to severe PAD pending intervention, new onset cardiomyopathy with LVEF 35 to 40% with RWMA, tobacco abuse, former alcohol abuse presented to the ER with chest pressure.  He was recently discharged from the [facility] hospital on 03/07/2024 after he was found to have imaging evidence of equivocal acute cholecystitis, normal HIDA scan.  He presented to the ER with chest pressure at that time as well and he denied having any abdominal pain ever.  He reported having chest pressure on 03/05/2024 and 03/06/2024 that lasted for couple of hours.  He presented to the ER for further management.  He did not have any chest pressure here.  He was discharged however last night at 2 AM, he was not able to sleep due to chest pressure and it lasted until he came to the ER this morning.  Associated with DOE.  Echocardiogram yesterday showed LVEF 35 to 40% with RWMA (entire inferior wall, anterolateral wall and basal inferoseptal segment are akinetic.  Anterior septum, mid inferoseptal segment, basal anterior segment are hypokinetic.  Remaining segments are normal).  He also reported having claudication in bilateral lower extremities, in the calves, worsening in the last 8 months and now occurring more at rest.  R ABI 0.79 and L ABI 0.75.  CT abdomen and pelvis imaging on 02/13/2024 showed near occlusive stenosis of the aortic bifurcation and origins of the internal iliac arteries bilaterally.  He also had imaging evidence of fusiform aneurysmal dilatation of the AAA measuring up to 2.4 across 2.9 cm intersection with extensive calcific and noncalcific plaque.  Marked hepatic steatosis as well as radiopaque  debris layering dependently within the gallbladder was noted.  Vitals remarkable for elevated blood pressure, 170/90 mmHg SBP.  Labs remarkable for transaminitis, AST 251, ALT 244, total bilirubin 1.4 (elevated compared to discharge), normal hemoglobin, Hs troponins mildly elevated, 18.  EKG showed ST depressions in the inferior and lateral leads.  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   Shoulder dislocation     Past Surgical History:  Procedure Laterality Date   APPENDECTOMY         Inpatient Medications: Scheduled Meds:  nicotine   21 mg Transdermal Once   Continuous Infusions:  PRN Meds:   Allergies:   No Known Allergies  Social History:   Social History   Socioeconomic History   Marital status: Married    Spouse name: Not on file   Number of children: Not on file   Years of education: Not on file   Highest education level: Not on file  Occupational History   Not on file  Tobacco Use   Smoking status: Every Day   Smokeless tobacco: Not on file  Vaping Use   Vaping status: Never Used  Substance and Sexual Activity   Alcohol use: Not Currently    Comment: patient reports no etoh since 01/2024 admission for alcoholic hepatitis/withdrawal   Drug use: No   Sexual activity: Yes  Other Topics Concern   Not on file  Social History Narrative   Not on file   Social Drivers of Health   Financial Resource Strain: Not on file  Food Insecurity: No Food Insecurity (03/06/2024)   Hunger Vital Sign    Worried About Running Out of Food in the Last Year: Never true    Ran Out of Food in the Last Year: Never true  Transportation Needs: No Transportation Needs (03/06/2024)   PRAPARE - Administrator, Civil Service (Medical): No    Lack of Transportation (Non-Medical): No  Physical Activity: Not on file  Stress: Not on file  Social Connections: Moderately Integrated (03/06/2024)   Social Connection and Isolation Panel     Frequency of Communication with Friends and Family: More than three times a week    Frequency of Social Gatherings with Friends and Family: Three times a week    Attends Religious Services: Never    Active Member of Clubs or Organizations: No    Attends Banker Meetings: 1 to 4 times per year    Marital Status: Married  Catering manager Violence: Not At Risk (03/06/2024)   Humiliation, Afraid, Rape, and Kick questionnaire    Fear of Current or Ex-Partner: No    Emotionally Abused: No    Physically Abused: No    Sexually Abused: No    Family History:    Family History  Problem Relation Age of Onset   Colon cancer Neg Hx    Cirrhosis Neg Hx      ROS:  Please see the history of present illness.  ROS  All other ROS reviewed and negative.     Physical Exam/Data:   Vitals:   03/08/24 1000 03/08/24 1300 03/08/24 1345 03/08/24 1535  BP: 136/85 (!) 156/88 (!) 170/90   Pulse: 61 (!) 56 69   Resp: 12 15 15    Temp:    98.3 F (36.8 C)  TempSrc:    Oral  SpO2: 96% 96% 94%   Weight:      Height:        Intake/Output Summary (Last 24 hours) at 03/08/2024 1641 Last data filed at 03/08/2024 1350 Gross per 24 hour  Intake 500 ml  Output --  Net 500 ml   Filed Weights   03/08/24 0827  Weight: 83 kg   Body mass index is 26.26 kg/m.  General:  Well nourished, well developed, in no acute distress HEENT: normal Lymph: no adenopathy Neck: no JVD Endocrine:  No thryomegaly Vascular: No carotid bruits; FA pulses 2+ bilaterally without bruits  Cardiac:  normal S1, S2; RRR; no murmur Lungs:  clear to auscultation bilaterally, no wheezing, rhonchi or rales  Abd: soft, nontender, no hepatomegaly  Ext: no edema Musculoskeletal:  No deformities, BUE and BLE strength normal and equal Skin: warm and dry  Neuro:  CNs 2-12 intact, no focal abnormalities noted Psych:  Normal affect   EKG:  The EKG was personally reviewed and demonstrates:   Telemetry:  Telemetry was  personally reviewed and demonstrates:    Relevant CV Studies:   Laboratory Data:  Chemistry Recent Labs  Lab 03/06/24 1458 03/07/24 0430 03/08/24 0903  NA 135 136 134*  K 3.6 3.6 3.7  CL 97* 100 101  CO2 24 26 24   GLUCOSE 94 88 117*  BUN 6 6 8   CREATININE 0.85 0.63 0.62  CALCIUM  8.7* 8.3* 9.3  GFRNONAA >60 >60 >60  ANIONGAP 14 10 9     Recent Labs  Lab 03/06/24 1458 03/07/24 0430 03/08/24 0903  PROT 6.2* 5.5* 6.2*  ALBUMIN 3.0* 2.8* 2.9*  AST 251* 138* 251*  ALT 197* 190* 244*  ALKPHOS  152* 137* 178*  BILITOT 1.4* 0.8 1.4*   Hematology Recent Labs  Lab 03/06/24 1458 03/07/24 0430 03/08/24 0903  WBC 12.8* 5.0 10.2  RBC 3.90* 3.51* 3.95*  HGB 14.6 12.6* 14.2  HCT 41.3 37.4* 41.1  MCV 105.9* 106.6* 104.1*  MCH 37.4* 35.9* 35.9*  MCHC 35.4 33.7 34.5  RDW 13.4 13.3 13.3  PLT 210 193 183   Cardiac EnzymesNo results for input(s): TROPONINI in the last 168 hours. No results for input(s): TROPIPOC in the last 168 hours.  BNPNo results for input(s): BNP, PROBNP in the last 168 hours.  DDimer  Recent Labs  Lab 03/06/24 1458  DDIMER 1.45*    Radiology/Studies:    Assessment and Plan:    Acute systolic and diastolic heart failure New onset/ischemic cardiomyopathy, LVEF 35 to 40% - Presented with chest pressure x 2 days this week and was found to have imaging evidence of acute cholecystitis.  Never had any abdominal pain.  He was discharged yesterday only to present again to the ER with similar complaints of chest pressure.  This chest pressure lasted for more than couple of hours on all occasions.  Associated with DOE with minimal activities.  EKG showed ST depressions in the inferior and lateral leads. Hs troponins are mildly elevated, 18>> 18. - Echocardiogram on 03/07/2024 showed new onset cardiomyopathy with LVEF 35 to 40% and RWMA, normal RV function, CVP 8 mmHg. - He has cardiac risk factors including tobacco abuse (for 30 years), alcohol abuse  (20 years, sober for the last 3 weeks), extensive PAD.  He likely has extensive CAD. - He will benefit from invasive ischemic evaluation with LHC on Monday, keep n.p.o. after midnight on Sunday night.  He needs to be transferred to [facility] on hospitalist team.  Cardiology will need to be consulted upon arrival to [facility]. - Elevated blood pressures on admission.  Start GDMT. - Start IV Lasix  40 mg once daily. - Start carvedilol  3.125 mg twice daily. - Start Entresto  24-26 mg twice daily. - Continue cardioprotective medications, continue aspirin  81 mg once daily, start rosuvastatin  40 mg at bedtime.  HTN, poorly controlled - Continue plan as above.  Acute cholecystitis - Ultrasound abdomen showed findings equivocal for acute cholecystitis.  Sonographic Murphy sign absent.  HIIDA can obtained today showed normal hepatobiliary scan, with filling of the gallbladder indicating cystic duct patency. - Patient denied having any abdominal pain ever. - I think his symptoms are more cardiac in origin and not related to gallbladder.  Cardiac workup as above.  Severe PAD with rest pain/neuropathy - Moderate PAD by ABI (R ABI 0.79 and L ABI 0.75).  Imaging from August 2025 showed near occlusive stenosis of the aortic bifurcation and origins of the internal iliac arteries bilaterally.  He will need peripheral angiogram sooner.  He has an upcoming appointment with vascular surgery on April 10, 2024. - Continue aspirin  81 mg once daily. - Start rosuvastatin  40 mg nightly.  Alcohol induced liver injury Fatty liver, severe - Liver enzymes significantly elevated, 100-200s.  These are chronically elevated. He was drinking alcohol for 20 years.  Sober for 3 weeks now.  He used to drink more than a paint and less than fifth. -Congratulated on alcohol abstinence.  Tobacco abuse - Smoking for the last 30 years, counseling.  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  If he stays, please follow the plan  above.  If he decides to leave (he is worried about paying medical bills), please send a staff message to me, Lukisha Pinnix and Dorothyann Koyanagi to schedule him for [facility] early next week and sent him home with above medications.  60 minutes spent in reviewing the prior records, imaging, more than the labs, discussion of the above problems with the patient, patient's wife at the bedside and the ER staff as well as documentation.   For questions or updates, please contact [facility] Please consult [email or web address] for contact info under Cardiology/STEMI.   Signed, Kyana Aicher Priya Nicasio Barlowe, MD 03/08/2024 4:41 PM

## 2024-03-08 NOTE — H&P (Signed)
 [facility] H&P   Patient Demographics:    Maxwell Rosales, is a 53 y.o. male  MRN: [id number]   DOB - 1971-02-06  Admit Date - 03/08/2024  Outpatient Primary MD for the patient is Glennon Sand, NP  Referring MD/NP/PA: Dr. Suzette  Patient coming from: Home  Chief Complaint  Patient presents with   Abdominal Pain      HPI:    Maxwell Rosales  is a 53 y.o. male,  past medical history of alcohol abuse, tobacco abuse, CAD, hypertension, hyperlipidemia, PAD, recent hospitalization, discharged 8/23 secondary to alcohol withdrawals, had total alcohol abstinence since, he had another admission 9/11 for chest pain, thought to be secondary to acute cholecystitis (he missed his vascular surgery follow-up as he was hospitalized) he was cleared by general surgery oral antibiotic but his workup significant for 2D echo showing LV EF at 35% with regional wall motion abnormalities. - Patient presents to ED today secondary to complaints of shortness of breath and chest pressure, reports chest pressure woke him up from sleep, he reports dyspnea on exertion, he is currently chest pain-free, denies fever, denies chills, no lower extremity edema, no hemoptysis - In ED blood pressure was elevated 170/90, labs were significant for worsening LFTs, AST 251, ALT 244, total bili 1.4, seen by general surgery, HIDA scan no concern of acute cholecystitis, troponins mildly elevated at 18, EKG showing ST depression in inferior and lateral leads, cardiology were consulted who recommended admission to [facility] for cardiac cath on Monday.    Review of systems:      A full 10 point Review of Systems was done, except as stated above, all other Review of Systems were negative.   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 Authorizing Provider  amoxicillin -clavulanate (AUGMENTIN ) 875-125 MG tablet Take 1 tablet by mouth 2 (two) times daily for 4 days. 03/07/24 03/11/24 Yes Briana Elgin LABOR, MD  aspirin  EC 81 MG tablet Take 1 tablet (81 mg total) by mouth daily with breakfast. Swallow  whole. 02/17/24  Yes Emokpae, Courage, MD  folic acid  (FOLVITE ) 1 MG tablet Take 1 tablet (1 mg total) by mouth daily. 03/01/24  Yes Glennon Sand, NP  ibuprofen  (ADVIL ) 200 MG tablet Take 400-600 mg by mouth every 6 (six) hours as needed for mild pain (pain score 1-3).   Yes [provider]  metoprolol  tartrate (LOPRESSOR ) 25 MG tablet Take 1 tablet (25 mg total) by mouth 2 (two) times daily. 03/01/24 03/01/25 Yes Glennon Sand, NP  Misc Natural Products (NEURIVA PO) Take 1 capsule by mouth daily. For nerve pain   Yes [provider]  nicotine  (NICODERM CQ  - DOSED IN MG/24 HOURS) 21 mg/24hr patch Place 1 patch (21 mg total) onto the skin daily. 02/18/24  Yes Emokpae, Courage, MD  traZODone  (DESYREL ) 150 MG tablet Take 1 tablet (150 mg total) by mouth at bedtime. 03/01/24   Glennon Sand, NP     Allergies:    No Known Allergies   Physical Exam:   Vitals  Blood pressure (!) 166/93, pulse 66, temperature 98.3 F (36.8 C), temperature source Oral, resp. rate (!) 21, height 5' 10 (1.778 m), weight 83 kg, SpO2 97%.   1. General Well  Developed male, laying in bed, no apparent distress  2. Normal affect and insight, Not Suicidal or Homicidal, Awake Alert, Oriented X 3.  3. No F.N deficits, ALL C.Nerves Intact, Strength 5/5 all 4 extremities, Sensation intact all 4 extremities, Plantars down going.  4. Ears and Eyes appear Normal, Conjunctivae clear, PERRLA. Moist Oral Mucosa.  5. Supple Neck, No JVD, No cervical lymphadenopathy appriciated, No Carotid Bruits.  6. Symmetrical Chest wall movement, Good air movement bilaterally, CTAB.  7. RRR, No Gallops, Rubs or Murmurs, No Parasternal Heave.  8. Positive Bowel Sounds, Abdomen Soft, No tenderness, No organomegaly appriciated,No rebound -guarding or rigidity.  9.  No Cyanosis, Normal Skin Turgor, No Skin Rash or Bruise.  10. Good muscle tone,  joints appear normal , no effusions, Normal ROM.     CBC Recent Labs  Lab 03/06/24 1458 03/07/24 0430 03/08/24 0903  WBC 12.8* 5.0 10.2  HGB 14.6 12.6* 14.2  HCT 41.3 37.4* 41.1  PLT 210 193 183  MCV 105.9* 106.6* 104.1*  MCH 37.4* 35.9* 35.9*  MCHC 35.4 33.7 34.5  RDW 13.4 13.3 13.3  LYMPHSABS 1.8  --  1.3  MONOABS 1.0  --  0.6  EOSABS 0.1  --  0.1  BASOSABS 0.1  --  0.1   ------------------------------------------------------------------------------------------------------------------  Chemistries  Recent Labs  Lab 03/06/24 1458 03/07/24 0430 03/07/24 0437 03/08/24 0903  NA 135 136  --  134*  K 3.6 3.6  --  3.7  CL 97* 100  --  101  CO2 24 26  --  24  GLUCOSE 94 88  --  117*  BUN 6 6  --  8  CREATININE 0.85 0.63  --  0.62  CALCIUM  8.7* 8.3*  --  9.3  MG  --   --  1.7  --   AST 251* 138*  --  251*  ALT 197* 190*  --  244*  ALKPHOS 152* 137*  --  178*  BILITOT 1.4* 0.8  --  1.4*   ------------------------------------------------------------------------------------------------------------------ estimated creatinine clearance is 109 mL/min (by C-G formula based on SCr of 0.62  mg/dL). ------------------------------------------------------------------------------------------------------------------ No results for input(s): TSH, T4TOTAL, T3FREE, THYROIDAB in the last 72 hours.  Invalid input(s): FREET3  Coagulation profile Recent Labs  Lab 03/08/24 0903  INR 0.9   -------------------------------------------------------------------------------------------------------------------  Recent Labs    03/06/24 1458  DDIMER 1.45*   -------------------------------------------------------------------------------------------------------------------  Cardiac Enzymes No results for input(s): CKMB, TROPONINI, MYOGLOBIN in the last 168 hours.  Invalid input(s): CK ------------------------------------------------------------------------------------------------------------------    Component Value Date/Time   BNP 308.0 (H) 02/13/2024 0819     ---------------------------------------------------------------------------------------------------------------  Urinalysis    Component Value Date/Time   COLORURINE AMBER (A) 02/13/2024 1745   APPEARANCEUR CLEAR 02/13/2024 1745   LABSPEC >1.046 (H) 02/13/2024 1745   PHURINE 5.0 02/13/2024 1745   GLUCOSEU NEGATIVE 02/13/2024 1745   HGBUR NEGATIVE 02/13/2024 1745   BILIRUBINUR NEGATIVE 02/13/2024 1745   KETONESUR 20 (A) 02/13/2024 1745   PROTEINUR NEGATIVE 02/13/2024 1745   NITRITE NEGATIVE 02/13/2024 1745   LEUKOCYTESUR NEGATIVE 02/13/2024 1745    ----------------------------------------------------------------------------------------------------------------   Imaging Results:    NM Hepatobiliary Liver Func Result Date: 03/08/2024 CLINICAL DATA:  Cholelithiasis.  Abdominal discomfort EXAM: NUCLEAR MEDICINE HEPATOBILIARY IMAGING TECHNIQUE: Sequential images of the abdomen were obtained out to 60 minutes following intravenous administration of radiopharmaceutical. RADIOPHARMACEUTICALS:  4.7 mCi Tc-40m   Choletec  IV COMPARISON:  Abdominal ultrasound 03/07/2024 FINDINGS: Prompt uptake of radiopharmaceutical from the blood pool. Biliary activity faintly visible at 10 minutes with bowel activity at 17 minutes. Gallbladder activity first becomes visible at 25 minutes. This indicates cystic duct patency and common bile duct patency. IMPRESSION: 1. Normal hepatobiliary scan, with filling of the gallbladder indicating cystic duct patency. Electronically Signed   By: Ryan Salvage M.D.   On: 03/08/2024 15:36   ECHOCARDIOGRAM COMPLETE Result Date: 03/07/2024    ECHOCARDIOGRAM REPORT   Patient Name:   Marquie Aderhold. Date of Exam: 03/07/2024 Medical Rec #:  [id number]             Height:       70.0 in Accession #:    [id number]            Weight:       183.0 lb Date of Birth:  08/25/1970              BSA:          2.010 m Patient Age:    53 years              BP:           135/78 mmHg Patient Gender: M                     HR:           82 bpm. Exam Location:  [name] Procedure: 2D Echo, 3D Echo, Cardiac Doppler, Color Doppler and Strain Analysis            (Both Spectral and Color Flow Doppler were utilized during            procedure). Indications:    Chest Pain R07.9  History:        Patient has no prior history of Echocardiogram examinations.                 Arrythmias:LBBB; Risk Factors:Hypertension and Current Smoker.  Sonographer:    Aida Pizza RCS Referring Phys: [phone] Julianna Vanwagner GORMAN LYE  Sonographer Comments: Global longitudinal strain was attempted. IMPRESSIONS  1. Left ventricular ejection fraction, by estimation, is 35 to 40%. Left ventricular ejection fraction by 3D volume is 40 %. The left ventricle has moderately decreased function. The left ventricle demonstrates regional wall motion abnormalities (see scoring diagram/findings for description). The left ventricular internal cavity size  was moderately to severely dilated. Left ventricular diastolic parameters are consistent with Grade I diastolic  dysfunction (impaired relaxation). Elevated left ventricular end-diastolic pressure. The average left ventricular global longitudinal strain is -11.0 %. The global longitudinal strain is abnormal.  2. Right ventricular systolic function is normal. The right ventricular size is normal. Tricuspid regurgitation signal is inadequate for assessing PA pressure.  3. The mitral valve is normal in structure. Trivial mitral valve regurgitation. No evidence of mitral stenosis.  4. The aortic valve is tricuspid. Aortic valve regurgitation is not visualized. No aortic stenosis is present.  5. The inferior vena cava is dilated in size with >50% respiratory variability, suggesting right atrial pressure of 8 mmHg. Comparison(s): No prior Echocardiogram. FINDINGS  Left Ventricle: Left ventricular ejection fraction, by estimation, is 35 to 40%. Left ventricular ejection fraction by 3D volume is 40 %. The left ventricle has moderately decreased function. The left ventricle demonstrates regional wall motion abnormalities. The average left ventricular global longitudinal strain is -11.0 %. Strain was performed and the global longitudinal strain is abnormal. The left ventricular internal cavity size was moderately to severely dilated. There is no left ventricular hypertrophy. Left ventricular diastolic parameters are consistent with Grade I diastolic dysfunction (impaired relaxation). Elevated left ventricular end-diastolic pressure.  LV Wall Scoring: The entire lateral wall, inferior wall, and basal inferoseptal segment are akinetic. The anterior septum, mid inferoseptal segment, and basal anterior segment are hypokinetic. The mid and distal anterior wall, apical septal segment, apical inferior segment, and apex are normal. Right Ventricle: The right ventricular size is normal. No increase in right ventricular wall thickness. Right ventricular systolic function is normal. Tricuspid regurgitation signal is inadequate for assessing PA  pressure. Left Atrium: Left atrial size was normal in size. Right Atrium: Right atrial size was normal in size. Pericardium: There is no evidence of pericardial effusion. Mitral Valve: The mitral valve is normal in structure. Trivial mitral valve regurgitation. No evidence of mitral valve stenosis. Tricuspid Valve: The tricuspid valve is normal in structure. Tricuspid valve regurgitation is trivial. No evidence of tricuspid stenosis. Aortic Valve: The aortic valve is tricuspid. Aortic valve regurgitation is not visualized. No aortic stenosis is present. Pulmonic Valve: The pulmonic valve was normal in structure. Pulmonic valve regurgitation is not visualized. No evidence of pulmonic stenosis. Aorta: The aortic root is normal in size and structure. Venous: The inferior vena cava is dilated in size with greater than 50% respiratory variability, suggesting right atrial pressure of 8 mmHg. IAS/Shunts: No atrial level shunt detected by color flow Doppler. Additional Comments: 3D was performed not requiring image post processing on an independent workstation and was abnormal.  LEFT VENTRICLE PLAX 2D LVIDd:         6.65 cm         Diastology LVIDs:         5.30 cm         LV e' medial:    4.21 cm/s LV PW:         1.15 cm         LV E/e' medial:  17.4 LV IVS:        1.05 cm         LV e' lateral:   5.52 cm/s LVOT diam:     1.80 cm         LV E/e' lateral: 13.2 LV SV:         58 LV SV Index:   29  2D Longitudinal LVOT Area:     2.54 cm        Strain                                2D Strain GLS   -11.0 %                                Avg: LV Volumes (MOD) LV vol d, MOD    177.0 ml      3D Volume EF A2C:                           LV 3D EF:    Left LV vol d, MOD    240.0 ml                   ventricul A4C:                                        ar LV vol s, MOD    112.0 ml                   ejection A2C:                                        fraction LV vol s, MOD    142.0 ml                   by 3D A4C:                                         volume is LV SV MOD A2C:   65.0 ml                    40 %. LV SV MOD A4C:   240.0 ml LV SV MOD BP:    85.9 ml                                3D Volume EF:                                3D EF:        40 %                                LV EDV:       226 ml                                LV ESV:       137 ml                                LV SV:        90 ml RIGHT VENTRICLE  RV S prime:     12.60 cm/s TAPSE (M-mode): 2.0 cm LEFT ATRIUM             Index        RIGHT ATRIUM           Index LA diam:        4.40 cm 2.19 cm/m   RA Area:     15.10 cm LA Vol (A2C):   46.2 ml 22.99 ml/m  RA Volume:   37.70 ml  18.76 ml/m LA Vol (A4C):   68.1 ml 33.88 ml/m LA Biplane Vol: 57.0 ml 28.36 ml/m  AORTIC VALVE LVOT Vmax:   107.00 cm/s LVOT Vmean:  66.500 cm/s LVOT VTI:    0.227 m  AORTA Ao Root diam: 3.40 cm MITRAL VALVE MV Area (PHT): 2.69 cm       SHUNTS MV Decel Time: 282 msec       Systemic VTI:  0.23 m MR Peak grad:    65.6 mmHg    Systemic Diam: 1.80 cm MR Mean grad:    42.0 mmHg MR Vmax:         405.00 cm/s MR Vmean:        304.0 cm/s MR PISA:         2.26 cm MR PISA Eff ROA: 12 mm MR PISA Radius:  0.60 cm MV E velocity: 73.10 cm/s MV A velocity: 94.40 cm/s MV E/A ratio:  0.77 Vishnu Priya Mallipeddi Electronically signed by Diannah Late Mallipeddi Signature Date/Time: 03/07/2024/4:26:41 PM    Final    US  Abdomen Limited RUQ (LIVER/GB) Result Date: 03/07/2024 CLINICAL DATA:  Acute cholecystitis EXAM: ULTRASOUND ABDOMEN LIMITED RIGHT UPPER QUADRANT COMPARISON:  02/13/2024 CT scan FINDINGS: Gallbladder: Abnormal gallbladder wall thickening 5 mm in single wall thickening. Sonographic Murphy's sign absent. Tumefactive sludge fills most of the gallbladder. Trace pericholecystic fluid along the anterior wall of the gallbladder. Common bile duct: Diameter: 0.3 cm Liver: No focal lesion identified. Coarse echogenic liver with poor sonic penetration compatible with diffuse hepatic steatosis.  Portal vein is patent on color Doppler imaging with normal direction of blood flow towards the liver. Other: None. IMPRESSION: 1. Abnormal gallbladder wall thickening with tumefactive sludge filling most of the gallbladder. Trace pericholecystic fluid along the anterior wall of the gallbladder. Sonographic Murphy's sign absent. Findings are equivocal for acute cholecystitis. 2. Coarse echogenic liver with poor sonic penetration compatible with diffuse hepatic steatosis. Electronically Signed   By: Ryan Salvage M.D.   On: 03/07/2024 11:35   CT Angio Chest PE W and/or Wo Contrast Result Date: 03/06/2024 CLINICAL DATA:  Chest pain while driving this afternoon, short of breath, diaphoresis, lower extremity numbness EXAM: CT ANGIOGRAPHY CHEST WITH CONTRAST TECHNIQUE: Multidetector CT imaging of the chest was performed using the standard protocol during bolus administration of intravenous contrast. Multiplanar CT image reconstructions and MIPs were obtained to evaluate the vascular anatomy. RADIATION DOSE REDUCTION: This exam was performed according to the departmental dose-optimization program which includes automated exposure control, adjustment of the mA and/or kV according to patient size and/or use of iterative reconstruction technique. CONTRAST:  75mL OMNIPAQUE  IOHEXOL  350 MG/ML SOLN COMPARISON:  02/13/2024 FINDINGS: Cardiovascular: This is a technically adequate evaluation of the pulmonary vasculature. No filling defects or pulmonary emboli. Stable cardiomegaly and left ventricular dilatation. No pericardial effusion. No evidence of thoracic aortic aneurysm or dissection. Atherosclerosis of the aorta and coronary vasculature. Mediastinum/Nodes: No enlarged mediastinal, hilar, or axillary lymph nodes. Thyroid gland, trachea, and esophagus demonstrate  no significant findings. Lungs/Pleura: Subpleural blebs within the bilateral apices. No acute airspace disease, effusion, or pneumothorax. The central airways  are patent. Upper Abdomen: There is high density material within the gallbladder consistent with gallbladder sludge. Gallbladder wall is thickened, with pericholecystic fat stranding, consistent with acute cholecystitis. Please correlate with laboratory evaluation and physical exam findings. Musculoskeletal: No acute or destructive bony abnormalities. Reconstructed images demonstrate no additional findings. Review of the MIP images confirms the above findings. IMPRESSION: 1. Abnormal appearance of the gallbladder consistent with gallbladder sludge and acute cholecystitis. Please correlate with physical exam findings and laboratory evaluation. If further imaging is required, right upper quadrant ultrasound could be performed. 2. No evidence of pulmonary embolus. 3. Stable cardiomegaly and left ventricular dilatation. 4. Aortic Atherosclerosis (ICD10-I70.0). Coronary artery atherosclerosis. Electronically Signed   By: Ozell Daring M.D.   On: 03/06/2024 19:14     EKG: Vent. rate 66 BPM PR interval 144 ms QRS duration 124 ms QT/QTcB 431/452 ms P-R-T axes 51 64 159 Sinus rhythm Nonspecific intraventricular conduction delay Abnormal T, consider ischemia, lateral leads No significant change since prior 9/25     Assessment & Plan:    Principal Problem:   Acute on chronic systolic CHF (congestive heart failure) (HCC) Active Problems:   Calculus of gallbladder with cholecystitis   Acute combined systolic and diastolic heart failure (HCC)   Ischemic cardiomyopathy   Acute cholecystitis   PAD (peripheral artery disease) (HCC)   Alcohol induced fatty liver   History of alcohol abuse   Primary hypertension   Tobacco abuse   Acute systolic/diastolic CHF New cardiomyopathy with a EF 35 to 40% Chest pain - Patient with recent hospitalization with concern for acute cystitis, his workup then significant for 2D echo showing new onset cardiomyopathy with EF 35 to 40%, and regional wall motion  abnormalities. - He presents with complaints of chest pain, EKG showing ST depressions, troponins mildly elevated 18 >> 18  - Cardiology input greatly appreciated, admission to admit to [facility] as will need LHC on Monday. - Start on IV Lasix  40 mg daily - Start on Coreg  3.125 mg twice daily - Start on Entresto  - Continue with aspirin  - Will hold on statin for now given elevated LFTs. - Continue with daily weights, strict ins and out  Hypertension, poorly controlled -Continue with Coreg , Entresto , will add as needed hydralazine   Transaminitis Fatty liver Acute cholecystitis? - Recent admission with CT chest and ultrasound with some concern for acute cholecystitis, afebrile, no leukocytosis, general surgery evaluation in ED appreciated, HIDA scan showing normal hepatobiliary scan, with filling of the gallbladder indicating cystic duct patency, low concern for acute cystitis. - Will continue with home Augmentin  for now - Patient with elevated LFTs,  seen by GI during recent hospitalization, recommendation for outpatient follow-up - LFTs keep trending up, workup indicate negative hepatitis panel - Possibly related to hepatic congestion from heart failure, continue to trend with diuresis  Severe PAD - Diagnosed with PAD, was supposed to follow-up with Dr. Drena 9/11 but given he was hospitalized then he missed that appointment, appointment was rescheduled for 04/20/2024, - Patient and family are concerned, requesting vascular surgery evaluation when he is at [facility], which should be reasonable, so we will have rounding team and notify vascular surgery that patient admitted to [name]   Tobacco abuse -counseled to continue with nicotine  patch  Infrarenal AAA 3.4 cm -Will need surveillance every 3 years  Alcohol abuse -Still abstinence from alcohol  since recently discharged on 8/23, so no concern for withdrawals, will keep on thiamine  and folic acid   DVT Prophylaxis  Heparin  -  AM Labs Ordered, also please review Full Orders  Family Communication: Admission, patients condition and plan of care including tests being ordered have been discussed with the patient and wife at bedside who indicate understanding and agree with the plan and Code Status.  Code Status full code  Likely DC to home  Consults called: Seen by cardiology and general surgery at [facility]  Admission status: Inpatient  Time spent in minutes : 70 minutes   Brayton Lye M.D on 03/08/2024 at 5:33 PM   [facility] - Office  [phone]

## 2024-03-08 NOTE — ED Notes (Signed)
 Pt left with transport to nuclear medicine for liver function

## 2024-03-08 NOTE — Hospital Course (Signed)
 Maxwell Rosales. is a 53 y.o. male with a history of chest/upper abdominal pain and found to have evidence of acute cholecystitis. Surgery deferred. Pain and LFTs improved. Patient discharged home with antibiotics.

## 2024-03-08 NOTE — ED Notes (Signed)
 Pts wife Joen at bedside verbalized concern for pts low BP. BP currently 110/74. Pt denies dizziness and lightheadness.

## 2024-03-08 NOTE — ED Provider Notes (Signed)
 [facility]GENCY DEPARTMENT AT [facility] Provider Note   CSN: [id number] Arrival date & time: 03/08/24  9186     Patient presents with: Abdominal Pain   Maxwell Rosales. is a 53 y.o. male.  He is presenting with worsening chest pain shortness of breath upper abdominal pain since yesterday afternoon.  He was just discharged from the hospital after being admitted for possible cholecystitis.  He said when he left the hospital his pain had resolved.  He has not picked up the prescription for antibiotics yet.  No fevers or chills.  Chest and upper abdominal pain radiated through to his back.  Makes him feel short of breath.  No vomiting or diarrhea.  No numbness or weakness or pain radiating down his legs.  Has some known aortic disease that needs vascular surgery follow-up.  Saw Dr. Mavis general surgery while in the hospital   The history is provided by the patient.  Abdominal Pain Pain location:  Epigastric and RUQ Pain quality: aching   Pain radiates to:  Back Pain severity:  Moderate Onset quality:  Gradual Duration:  2 days Timing:  Constant Progression:  Unchanged Chronicity:  Recurrent Relieved by:  None tried Associated symptoms: chest pain, nausea and shortness of breath   Associated symptoms: no cough, no diarrhea, no dysuria, no fever, no hematemesis and no vomiting        Prior to Admission medications   Medication Sig Start Date End Date Taking? Authorizing Provider  amoxicillin -clavulanate (AUGMENTIN ) 875-125 MG tablet Take 1 tablet by mouth 2 (two) times daily for 4 days. 03/07/24 03/11/24  Briana Elgin LABOR, MD  aspirin  EC 81 MG tablet Take 1 tablet (81 mg total) by mouth daily with breakfast. Swallow whole. 02/17/24   Pearlean Manus, MD  folic acid  (FOLVITE ) 1 MG tablet Take 1 tablet (1 mg total) by mouth daily. 03/01/24   Boswell, Chelsa, NP  metoprolol  tartrate (LOPRESSOR ) 25 MG tablet Take 1 tablet (25 mg total) by mouth 2 (two) times daily.  03/01/24 03/01/25  Glennon Sand, NP  Misc Natural Products (NEURIVA PO) Take 1 capsule by mouth daily. For nerve pain    [provider]  nicotine  (NICODERM CQ  - DOSED IN MG/24 HOURS) 21 mg/24hr patch Place 1 patch (21 mg total) onto the skin daily. 02/18/24   Pearlean Manus, MD  traZODone  (DESYREL ) 150 MG tablet Take 1 tablet (150 mg total) by mouth at bedtime. 03/01/24   Boswell, Chelsa, NP    Allergies: Patient has no known allergies.    Review of Systems  Constitutional:  Negative for fever.  Respiratory:  Positive for shortness of breath. Negative for cough.   Cardiovascular:  Positive for chest pain.  Gastrointestinal:  Positive for abdominal pain and nausea. Negative for diarrhea, hematemesis and vomiting.  Genitourinary:  Negative for dysuria.    Updated Vital Signs BP (!) 172/92   Pulse 61   Temp 97.9 F (36.6 C) (Oral)   Resp 12   Ht 5' 10 (1.778 m)   Wt 83 kg   SpO2 98%   BMI 26.26 kg/m   Physical Exam Vitals and nursing note reviewed.  Constitutional:      Appearance: Normal appearance. He is well-developed.  HENT:     Head: Normocephalic and atraumatic.  Eyes:     Conjunctiva/sclera: Conjunctivae normal.  Cardiovascular:     Rate and Rhythm: Normal rate and regular rhythm.     Heart sounds: No murmur heard. Pulmonary:  Effort: Pulmonary effort is normal. No respiratory distress.     Breath sounds: Normal breath sounds.  Abdominal:     Palpations: Abdomen is soft.     Tenderness: There is abdominal tenderness in the right upper quadrant and epigastric area. There is no guarding or rebound.  Musculoskeletal:     Cervical back: Neck supple.     Right lower leg: No edema.     Left lower leg: No edema.  Skin:    General: Skin is warm and dry.  Neurological:     General: No focal deficit present.     Mental Status: He is alert.     GCS: GCS eye subscore is 4. GCS verbal subscore is 5. GCS motor subscore is 6.     Motor: No weakness.      (all labs ordered are listed, but only abnormal results are displayed) Labs Reviewed  COMPREHENSIVE METABOLIC PANEL WITH GFR - Abnormal; Notable for the following components:      Result Value   Sodium 134 (*)    Glucose, Bld 117 (*)    Total Protein 6.2 (*)    Albumin 2.9 (*)    AST 251 (*)    ALT 244 (*)    Alkaline Phosphatase 178 (*)    Total Bilirubin 1.4 (*)    All other components within normal limits  LIPASE, BLOOD - Abnormal; Notable for the following components:   Lipase 114 (*)    All other components within normal limits  CBC WITH DIFFERENTIAL/PLATELET - Abnormal; Notable for the following components:   RBC 3.95 (*)    MCV 104.1 (*)    MCH 35.9 (*)    Neutro Abs 8.1 (*)    All other components within normal limits  TROPONIN I (HIGH SENSITIVITY) - Abnormal; Notable for the following components:   Troponin I (High Sensitivity) 18 (*)    All other components within normal limits  TROPONIN I (HIGH SENSITIVITY) - Abnormal; Notable for the following components:   Troponin I (High Sensitivity) 18 (*)    All other components within normal limits  PROTIME-INR  ETHANOL    EKG: EKG Interpretation Date/Time:  Friday March 08 2024 09:08:03 EDT Ventricular Rate:  66 PR Interval:  144 QRS Duration:  124 QT Interval:  431 QTC Calculation: 452 R Axis:   64  Text Interpretation: Sinus rhythm Nonspecific intraventricular conduction delay Abnormal T, consider ischemia, lateral leads No significant change since prior 9/25 Confirmed by Towana Sharper [phone]) on 03/08/2024 9:11:55 AM  Radiology: NM Hepatobiliary Liver Func Result Date: 03/08/2024 CLINICAL DATA:  Cholelithiasis.  Abdominal discomfort EXAM: NUCLEAR MEDICINE HEPATOBILIARY IMAGING TECHNIQUE: Sequential images of the abdomen were obtained out to 60 minutes following intravenous administration of radiopharmaceutical. RADIOPHARMACEUTICALS:  4.7 mCi Tc-86m  Choletec  IV COMPARISON:  Abdominal ultrasound  03/07/2024 FINDINGS: Prompt uptake of radiopharmaceutical from the blood pool. Biliary activity faintly visible at 10 minutes with bowel activity at 17 minutes. Gallbladder activity first becomes visible at 25 minutes. This indicates cystic duct patency and common bile duct patency. IMPRESSION: 1. Normal hepatobiliary scan, with filling of the gallbladder indicating cystic duct patency. Electronically Signed   By: Ryan Salvage M.D.   On: 03/08/2024 15:36   ECHOCARDIOGRAM COMPLETE Result Date: 03/07/2024    ECHOCARDIOGRAM REPORT   Patient Name:   Maxwell Rosales. Date of Exam: 03/07/2024 Medical Rec #:  [id number]             Height:  70.0 in Accession #:    [id number]            Weight:       183.0 lb Date of Birth:  January 08, 1971              BSA:          2.010 m Patient Age:    53 years              BP:           135/78 mmHg Patient Gender: M                     HR:           82 bpm. Exam Location:  [name] Procedure: 2D Echo, 3D Echo, Cardiac Doppler, Color Doppler and Strain Analysis            (Both Spectral and Color Flow Doppler were utilized during            procedure). Indications:    Chest Pain R07.9  History:        Patient has no prior history of Echocardiogram examinations.                 Arrythmias:LBBB; Risk Factors:Hypertension and Current Smoker.  Sonographer:    Aida Pizza RCS Referring Phys: [phone] DAWOOD GORMAN LYE  Sonographer Comments: Global longitudinal strain was attempted. IMPRESSIONS  1. Left ventricular ejection fraction, by estimation, is 35 to 40%. Left ventricular ejection fraction by 3D volume is 40 %. The left ventricle has moderately decreased function. The left ventricle demonstrates regional wall motion abnormalities (see scoring diagram/findings for description). The left ventricular internal cavity size was moderately to severely dilated. Left ventricular diastolic parameters are consistent with Grade I diastolic dysfunction (impaired relaxation). Elevated  left ventricular end-diastolic pressure. The average left ventricular global longitudinal strain is -11.0 %. The global longitudinal strain is abnormal.  2. Right ventricular systolic function is normal. The right ventricular size is normal. Tricuspid regurgitation signal is inadequate for assessing PA pressure.  3. The mitral valve is normal in structure. Trivial mitral valve regurgitation. No evidence of mitral stenosis.  4. The aortic valve is tricuspid. Aortic valve regurgitation is not visualized. No aortic stenosis is present.  5. The inferior vena cava is dilated in size with >50% respiratory variability, suggesting right atrial pressure of 8 mmHg. Comparison(s): No prior Echocardiogram. FINDINGS  Left Ventricle: Left ventricular ejection fraction, by estimation, is 35 to 40%. Left ventricular ejection fraction by 3D volume is 40 %. The left ventricle has moderately decreased function. The left ventricle demonstrates regional wall motion abnormalities. The average left ventricular global longitudinal strain is -11.0 %. Strain was performed and the global longitudinal strain is abnormal. The left ventricular internal cavity size was moderately to severely dilated. There is no left ventricular hypertrophy. Left ventricular diastolic parameters are consistent with Grade I diastolic dysfunction (impaired relaxation). Elevated left ventricular end-diastolic pressure.  LV Wall Scoring: The entire lateral wall, inferior wall, and basal inferoseptal segment are akinetic. The anterior septum, mid inferoseptal segment, and basal anterior segment are hypokinetic. The mid and distal anterior wall, apical septal segment, apical inferior segment, and apex are normal. Right Ventricle: The right ventricular size is normal. No increase in right ventricular wall thickness. Right ventricular systolic function is normal. Tricuspid regurgitation signal is inadequate for assessing PA pressure. Left Atrium: Left atrial size was  normal in size. Right Atrium: Right atrial size  was normal in size. Pericardium: There is no evidence of pericardial effusion. Mitral Valve: The mitral valve is normal in structure. Trivial mitral valve regurgitation. No evidence of mitral valve stenosis. Tricuspid Valve: The tricuspid valve is normal in structure. Tricuspid valve regurgitation is trivial. No evidence of tricuspid stenosis. Aortic Valve: The aortic valve is tricuspid. Aortic valve regurgitation is not visualized. No aortic stenosis is present. Pulmonic Valve: The pulmonic valve was normal in structure. Pulmonic valve regurgitation is not visualized. No evidence of pulmonic stenosis. Aorta: The aortic root is normal in size and structure. Venous: The inferior vena cava is dilated in size with greater than 50% respiratory variability, suggesting right atrial pressure of 8 mmHg. IAS/Shunts: No atrial level shunt detected by color flow Doppler. Additional Comments: 3D was performed not requiring image post processing on an independent workstation and was abnormal.  LEFT VENTRICLE PLAX 2D LVIDd:         6.65 cm         Diastology LVIDs:         5.30 cm         LV e' medial:    4.21 cm/s LV PW:         1.15 cm         LV E/e' medial:  17.4 LV IVS:        1.05 cm         LV e' lateral:   5.52 cm/s LVOT diam:     1.80 cm         LV E/e' lateral: 13.2 LV SV:         58 LV SV Index:   29              2D Longitudinal LVOT Area:     2.54 cm        Strain                                2D Strain GLS   -11.0 %                                Avg: LV Volumes (MOD) LV vol d, MOD    177.0 ml      3D Volume EF A2C:                           LV 3D EF:    Left LV vol d, MOD    240.0 ml                   ventricul A4C:                                        ar LV vol s, MOD    112.0 ml                   ejection A2C:                                        fraction LV vol s, MOD    142.0 ml  by 3D A4C:                                        volume is LV  SV MOD A2C:   65.0 ml                    40 %. LV SV MOD A4C:   240.0 ml LV SV MOD BP:    85.9 ml                                3D Volume EF:                                3D EF:        40 %                                LV EDV:       226 ml                                LV ESV:       137 ml                                LV SV:        90 ml RIGHT VENTRICLE RV S prime:     12.60 cm/s TAPSE (M-mode): 2.0 cm LEFT ATRIUM             Index        RIGHT ATRIUM           Index LA diam:        4.40 cm 2.19 cm/m   RA Area:     15.10 cm LA Vol (A2C):   46.2 ml 22.99 ml/m  RA Volume:   37.70 ml  18.76 ml/m LA Vol (A4C):   68.1 ml 33.88 ml/m LA Biplane Vol: 57.0 ml 28.36 ml/m  AORTIC VALVE LVOT Vmax:   107.00 cm/s LVOT Vmean:  66.500 cm/s LVOT VTI:    0.227 m  AORTA Ao Root diam: 3.40 cm MITRAL VALVE MV Area (PHT): 2.69 cm       SHUNTS MV Decel Time: 282 msec       Systemic VTI:  0.23 m MR Peak grad:    65.6 mmHg    Systemic Diam: 1.80 cm MR Mean grad:    42.0 mmHg MR Vmax:         405.00 cm/s MR Vmean:        304.0 cm/s MR PISA:         2.26 cm MR PISA Eff ROA: 12 mm MR PISA Radius:  0.60 cm MV E velocity: 73.10 cm/s MV A velocity: 94.40 cm/s MV E/A ratio:  0.77 Vishnu Priya Mallipeddi Electronically signed by Diannah Late Mallipeddi Signature Date/Time: 03/07/2024/4:26:41 PM    Final    US  Abdomen Limited RUQ (LIVER/GB) Result Date: 03/07/2024 CLINICAL DATA:  Acute cholecystitis EXAM: ULTRASOUND ABDOMEN LIMITED RIGHT UPPER QUADRANT COMPARISON:  02/13/2024 CT scan FINDINGS: Gallbladder: Abnormal gallbladder wall thickening 5 mm in single wall thickening. Sonographic  Murphy's sign absent. Tumefactive sludge fills most of the gallbladder. Trace pericholecystic fluid along the anterior wall of the gallbladder. Common bile duct: Diameter: 0.3 cm Liver: No focal lesion identified. Coarse echogenic liver with poor sonic penetration compatible with diffuse hepatic steatosis. Portal vein is patent on color Doppler  imaging with normal direction of blood flow towards the liver. Other: None. IMPRESSION: 1. Abnormal gallbladder wall thickening with tumefactive sludge filling most of the gallbladder. Trace pericholecystic fluid along the anterior wall of the gallbladder. Sonographic Murphy's sign absent. Findings are equivocal for acute cholecystitis. 2. Coarse echogenic liver with poor sonic penetration compatible with diffuse hepatic steatosis. Electronically Signed   By: Ryan Salvage M.D.   On: 03/07/2024 11:35   CT Angio Chest PE W and/or Wo Contrast Result Date: 03/06/2024 CLINICAL DATA:  Chest pain while driving this afternoon, short of breath, diaphoresis, lower extremity numbness EXAM: CT ANGIOGRAPHY CHEST WITH CONTRAST TECHNIQUE: Multidetector CT imaging of the chest was performed using the standard protocol during bolus administration of intravenous contrast. Multiplanar CT image reconstructions and MIPs were obtained to evaluate the vascular anatomy. RADIATION DOSE REDUCTION: This exam was performed according to the departmental dose-optimization program which includes automated exposure control, adjustment of the mA and/or kV according to patient size and/or use of iterative reconstruction technique. CONTRAST:  75mL OMNIPAQUE  IOHEXOL  350 MG/ML SOLN COMPARISON:  02/13/2024 FINDINGS: Cardiovascular: This is a technically adequate evaluation of the pulmonary vasculature. No filling defects or pulmonary emboli. Stable cardiomegaly and left ventricular dilatation. No pericardial effusion. No evidence of thoracic aortic aneurysm or dissection. Atherosclerosis of the aorta and coronary vasculature. Mediastinum/Nodes: No enlarged mediastinal, hilar, or axillary lymph nodes. Thyroid gland, trachea, and esophagus demonstrate no significant findings. Lungs/Pleura: Subpleural blebs within the bilateral apices. No acute airspace disease, effusion, or pneumothorax. The central airways are patent. Upper Abdomen: There is  high density material within the gallbladder consistent with gallbladder sludge. Gallbladder wall is thickened, with pericholecystic fat stranding, consistent with acute cholecystitis. Please correlate with laboratory evaluation and physical exam findings. Musculoskeletal: No acute or destructive bony abnormalities. Reconstructed images demonstrate no additional findings. Review of the MIP images confirms the above findings. IMPRESSION: 1. Abnormal appearance of the gallbladder consistent with gallbladder sludge and acute cholecystitis. Please correlate with physical exam findings and laboratory evaluation. If further imaging is required, right upper quadrant ultrasound could be performed. 2. No evidence of pulmonary embolus. 3. Stable cardiomegaly and left ventricular dilatation. 4. Aortic Atherosclerosis (ICD10-I70.0). Coronary artery atherosclerosis. Electronically Signed   By: Ozell Daring M.D.   On: 03/06/2024 19:14     Procedures   Medications Ordered in the ED  nicotine  (NICODERM CQ  - dosed in mg/24 hours) patch 21 mg (21 mg Transdermal Patch Applied 03/08/24 1348)  sodium chloride  0.9 % bolus 500 mL (0 mLs Intravenous Stopped 03/08/24 1350)  ondansetron  (ZOFRAN ) injection 4 mg (4 mg Intravenous Given 03/08/24 0955)  fentaNYL  (SUBLIMAZE ) injection 50 mcg (50 mcg Intravenous Given 03/08/24 0956)  technetium TC 99M  mebrofenin  (CHOLETEC ) injection 5 millicurie (4.65 millicuries Intravenous Contrast Given 03/08/24 1400)    Clinical Course as of 03/08/24 1155  Fri Mar 08, 2024  0859 I updated general surgery Dr. Mavis that the patient is return to the department.  He said he did not need an ultrasound at this time. [MB]  [id number] Patient seen by Dr. Mavis.  He is ordering a HIDA scan.  Recommends keep n.p.o. for now. [MB]  1154 Patient had a brief episode of wide-complex  tachycardia possibly V. tach.  He was asymptomatic at that time. [MB]    Clinical Course User Index [MB] Towana Ozell BROCKS, MD                                  Medical Decision Making Amount and/or Complexity of Data Reviewed Labs: ordered.  Risk OTC drugs. Prescription drug management.   This patient complains of chest and abdominal pain,; this involves an extensive number of treatment Options and is a complaint that carries with it a high risk of complications and morbidity. The differential includes cholelithiasis, cholecystitis, peptic ulcer disease, ACS, vascular  I ordered, reviewed and interpreted labs, which included CBC stable, chemistries with mildly elevated LFTs, lipase elevated, troponins mildly elevated I ordered medication IV pain medication fluids nausea medicine and reviewed PMP when indicated. I ordered imaging studies which included HIDA scan and I independently    visualized and interpreted imaging which showed no evidence of acute obstruction Additional history obtained from patient's wife Previous records obtained and reviewed in epic including recent general surgery and discharge summary I consulted general surgery Dr. Mavis and discussed lab and imaging findings and discussed disposition.  Cardiac monitoring reviewed, sinus rhythm Social determinants considered, tobacco use Critical Interventions: None  After the interventions stated above, I reevaluated the patient and found patient still to be uncomfortable Admission and further testing considered, his care is signed out to Dr. Suzette to work on medical admission.  General surgery is looking for cardiac clearance before proceeding with any type of procedure      Final diagnoses:  Upper abdominal pain  Elevated LFTs    ED Discharge Orders     None          Towana Ozell BROCKS, MD 03/08/24 [phone]

## 2024-03-08 NOTE — Consult Note (Signed)
 Reason for Consult: Chest pain, right upper quadrant abdominal pain, known cholelithiasis Referring Physician: Dr. Towana Ned Burhan Barham. is an 53 y.o. male.  HPI: Patient is a 53 year old white male who was just discharged yesterday afternoon after workup for upper abdominal pain.  He states that at approximately 2 AM this morning, he started having chest pain which radiated to his back.  He also said he had mild right upper quadrant abdominal discomfort soon after that.  He denies any nausea or vomiting.  As the pain did not resolve, he came back to the emergency room.  He currently complains more of chest pain that he does abdominal pain.  During his recent workup, he was found to have biliary sludge with a mildly thickened gallbladder wall but no Murphy sign on ultrasound.  He had evidence of hepatic steatosis.  GI was consulted and hepatitis screen was negative.  They were going to see him in follow-up in 3 to 4 weeks.  He also had an echo of his heart which revealed an ejection fraction of 35-40%.  This was a new finding.  He was set up to see cardiology as an outpatient.  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  He is going to be seen by vascular surgery.  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 He does not have any smokeless tobacco history on file. He reports that he does not currently use alcohol. He reports that he does not use drugs.  Allergies: No Known Allergies  Medications: I have reviewed the patient's current medications. Prior to Admission: (Not in a hospital admission)   Results for orders placed or performed during the hospital encounter of 03/08/24 (from the past  48 hours)  Comprehensive metabolic panel     Status: Abnormal   Collection Time: 03/08/24  9:03 AM  Result Value Ref Range   Sodium 134 (L) 135 - 145 mmol/L   Potassium 3.7 3.5 - 5.1 mmol/L   Chloride 101 98 - 111 mmol/L   CO2 24 22 - 32 mmol/L   Glucose, Bld 117 (H) 70 - 99 mg/dL    Comment: Glucose reference range applies only to samples taken after fasting for at least 8 hours.   BUN 8 6 - 20 mg/dL   Creatinine, Ser 9.37 0.61 - 1.24 mg/dL   Calcium  9.3 8.9 - 10.3 mg/dL   Total Protein 6.2 (L) 6.5 - 8.1 g/dL   Albumin 2.9 (L) 3.5 - 5.0 g/dL   AST 748 (H) 15 - 41 U/L   ALT 244 (H) 0 - 44 U/L   Alkaline Phosphatase 178 (H) 38 - 126 U/L   Total Bilirubin 1.4 (H) 0.0 - 1.2 mg/dL   GFR, Estimated >39 >39 mL/min    Comment: (NOTE) Calculated using the CKD-EPI Creatinine Equation (2021)    Anion gap 9 5 - 15    Comment: Performed at [facility], [address] [place], [place] [address]  Lipase, blood     Status: Abnormal   Collection Time: 03/08/24  9:03 AM  Result Value Ref Range   Lipase  114 (H) 11 - 51 U/L    Comment: Performed at [facility], [address], [place], [place] [address]  CBC with Differential     Status: Abnormal (Preliminary result)   Collection Time: 03/08/24  9:03 AM  Result Value Ref Range   WBC PENDING 4.0 - 10.5 K/uL   RBC 3.95 (L) 4.22 - 5.81 MIL/uL   Hemoglobin 14.2 13.0 - 17.0 g/dL   HCT 58.8 60.9 - 47.9 %   MCV 104.1 (H) 80.0 - 100.0 fL   MCH 35.9 (H) 26.0 - 34.0 pg   MCHC 34.5 30.0 - 36.0 g/dL   RDW 86.6 88.4 - 84.4 %   Platelets 183 150 - 400 K/uL    Comment: Performed at [facility], [address], [place], [place] [address]   nRBC PENDING 0.0 - 0.2 %   Neutrophils Relative % PENDING %   Neutro Abs PENDING 1.7 - 7.7 K/uL   Band Neutrophils PENDING %   Lymphocytes Relative PENDING %   Lymphs Abs PENDING 0.7 - 4.0 K/uL   Monocytes Relative PENDING %   Monocytes Absolute PENDING 0.1 - 1.0 K/uL   Eosinophils Relative PENDING %    Eosinophils Absolute PENDING 0.0 - 0.5 K/uL   Basophils Relative PENDING %   Basophils Absolute PENDING 0.0 - 0.1 K/uL   WBC Morphology PENDING    RBC Morphology PENDING    Smear Review PENDING    Other PENDING %   nRBC PENDING 0 /100 WBC   Metamyelocytes Relative PENDING %   Myelocytes PENDING %   Promyelocytes Relative PENDING %   Blasts PENDING %   Immature Granulocytes PENDING %   Abs Immature Granulocytes PENDING 0.00 - 0.07 K/uL  Troponin I (High Sensitivity)     Status: Abnormal   Collection Time: 03/08/24  9:03 AM  Result Value Ref Range   Troponin I (High Sensitivity) 18 (H) <18 ng/L    Comment: (NOTE) Elevated high sensitivity troponin I (hsTnI) values and significant  changes across serial measurements may suggest ACS but many other  chronic and acute conditions are known to elevate hsTnI results.  Refer to the Links section for chest pain algorithms and additional  guidance. Performed at [facility], [address], [place], [place] [address]   Protime-INR     Status: None   Collection Time: 03/08/24  9:03 AM  Result Value Ref Range   Prothrombin Time 12.5 11.4 - 15.2 seconds   INR 0.9 0.8 - 1.2    Comment: (NOTE) INR goal varies based on device and disease states. Performed at [facility], [address] [place], [place] [address]   Ethanol     Status: None   Collection Time: 03/08/24  9:03 AM  Result Value Ref Range   Alcohol, Ethyl (B) <15 <15 mg/dL    Comment: (NOTE) For medical purposes only. Performed at [facility], [address], [place], [place] [address]     ECHOCARDIOGRAM COMPLETE Result Date: 03/07/2024    ECHOCARDIOGRAM REPORT   Patient Name:   Thaddius Manes. Date of Exam: 03/07/2024 Medical Rec #:  [id number]             Height:       70.0 in Accession #:    [id number]            Weight:       183.0 lb Date of Birth:  [birth year]  BSA:          2.010 m Patient Age:    53 years              BP:           135/78 mmHg Patient  Gender: M                     HR:           82 bpm. Exam Location:  [name] Procedure: 2D Echo, 3D Echo, Cardiac Doppler, Color Doppler and Strain Analysis            (Both Spectral and Color Flow Doppler were utilized during            procedure). Indications:    Chest Pain R07.9  History:        Patient has no prior history of Echocardiogram examinations.                 Arrythmias:LBBB; Risk Factors:Hypertension and Current Smoker.  Sonographer:    Aida Pizza RCS Referring Phys: [phone] DAWOOD GORMAN LYE  Sonographer Comments: Global longitudinal strain was attempted. IMPRESSIONS  1. Left ventricular ejection fraction, by estimation, is 35 to 40%. Left ventricular ejection fraction by 3D volume is 40 %. The left ventricle has moderately decreased function. The left ventricle demonstrates regional wall motion abnormalities (see scoring diagram/findings for description). The left ventricular internal cavity size was moderately to severely dilated. Left ventricular diastolic parameters are consistent with Grade I diastolic dysfunction (impaired relaxation). Elevated left ventricular end-diastolic pressure. The average left ventricular global longitudinal strain is -11.0 %. The global longitudinal strain is abnormal.  2. Right ventricular systolic function is normal. The right ventricular size is normal. Tricuspid regurgitation signal is inadequate for assessing PA pressure.  3. The mitral valve is normal in structure. Trivial mitral valve regurgitation. No evidence of mitral stenosis.  4. The aortic valve is tricuspid. Aortic valve regurgitation is not visualized. No aortic stenosis is present.  5. The inferior vena cava is dilated in size with >50% respiratory variability, suggesting right atrial pressure of 8 mmHg. Comparison(s): No prior Echocardiogram. FINDINGS  Left Ventricle: Left ventricular ejection fraction, by estimation, is 35 to 40%. Left ventricular ejection fraction by 3D volume is 40 %. The left  ventricle has moderately decreased function. The left ventricle demonstrates regional wall motion abnormalities. The average left ventricular global longitudinal strain is -11.0 %. Strain was performed and the global longitudinal strain is abnormal. The left ventricular internal cavity size was moderately to severely dilated. There is no left ventricular hypertrophy. Left ventricular diastolic parameters are consistent with Grade I diastolic dysfunction (impaired relaxation). Elevated left ventricular end-diastolic pressure.  LV Wall Scoring: The entire lateral wall, inferior wall, and basal inferoseptal segment are akinetic. The anterior septum, mid inferoseptal segment, and basal anterior segment are hypokinetic. The mid and distal anterior wall, apical septal segment, apical inferior segment, and apex are normal. Right Ventricle: The right ventricular size is normal. No increase in right ventricular wall thickness. Right ventricular systolic function is normal. Tricuspid regurgitation signal is inadequate for assessing PA pressure. Left Atrium: Left atrial size was normal in size. Right Atrium: Right atrial size was normal in size. Pericardium: There is no evidence of pericardial effusion. Mitral Valve: The mitral valve is normal in structure. Trivial mitral valve regurgitation. No evidence of mitral valve stenosis. Tricuspid Valve: The tricuspid valve is normal in structure. Tricuspid valve regurgitation is trivial. No  evidence of tricuspid stenosis. Aortic Valve: The aortic valve is tricuspid. Aortic valve regurgitation is not visualized. No aortic stenosis is present. Pulmonic Valve: The pulmonic valve was normal in structure. Pulmonic valve regurgitation is not visualized. No evidence of pulmonic stenosis. Aorta: The aortic root is normal in size and structure. Venous: The inferior vena cava is dilated in size with greater than 50% respiratory variability, suggesting right atrial pressure of 8 mmHg.  IAS/Shunts: No atrial level shunt detected by color flow Doppler. Additional Comments: 3D was performed not requiring image post processing on an independent workstation and was abnormal.  LEFT VENTRICLE PLAX 2D LVIDd:         6.65 cm         Diastology LVIDs:         5.30 cm         LV e' medial:    4.21 cm/s LV PW:         1.15 cm         LV E/e' medial:  17.4 LV IVS:        1.05 cm         LV e' lateral:   5.52 cm/s LVOT diam:     1.80 cm         LV E/e' lateral: 13.2 LV SV:         58 LV SV Index:   29              2D Longitudinal LVOT Area:     2.54 cm        Strain                                2D Strain GLS   -11.0 %                                Avg: LV Volumes (MOD) LV vol d, MOD    177.0 ml      3D Volume EF A2C:                           LV 3D EF:    Left LV vol d, MOD    240.0 ml                   ventricul A4C:                                        ar LV vol s, MOD    112.0 ml                   ejection A2C:                                        fraction LV vol s, MOD    142.0 ml                   by 3D A4C:  volume is LV SV MOD A2C:   65.0 ml                    40 %. LV SV MOD A4C:   240.0 ml LV SV MOD BP:    85.9 ml                                3D Volume EF:                                3D EF:        40 %                                LV EDV:       226 ml                                LV ESV:       137 ml                                LV SV:        90 ml RIGHT VENTRICLE RV S prime:     12.60 cm/s TAPSE (M-mode): 2.0 cm LEFT ATRIUM             Index        RIGHT ATRIUM           Index LA diam:        4.40 cm 2.19 cm/m   RA Area:     15.10 cm LA Vol (A2C):   46.2 ml 22.99 ml/m  RA Volume:   37.70 ml  18.76 ml/m LA Vol (A4C):   68.1 ml 33.88 ml/m LA Biplane Vol: 57.0 ml 28.36 ml/m  AORTIC VALVE LVOT Vmax:   107.00 cm/s LVOT Vmean:  66.500 cm/s LVOT VTI:    0.227 m  AORTA Ao Root diam: 3.40 cm MITRAL VALVE MV Area (PHT): 2.69 cm       SHUNTS MV  Decel Time: 282 msec       Systemic VTI:  0.23 m MR Peak grad:    65.6 mmHg    Systemic Diam: 1.80 cm MR Mean grad:    42.0 mmHg MR Vmax:         405.00 cm/s MR Vmean:        304.0 cm/s MR PISA:         2.26 cm MR PISA Eff ROA: 12 mm MR PISA Radius:  0.60 cm MV E velocity: 73.10 cm/s MV A velocity: 94.40 cm/s MV E/A ratio:  0.77 Vishnu Priya Mallipeddi Electronically signed by Diannah Late Mallipeddi Signature Date/Time: 03/07/2024/4:26:41 PM    Final    US  Abdomen Limited RUQ (LIVER/GB) Result Date: 03/07/2024 CLINICAL DATA:  Acute cholecystitis EXAM: ULTRASOUND ABDOMEN LIMITED RIGHT UPPER QUADRANT COMPARISON:  02/13/2024 CT scan FINDINGS: Gallbladder: Abnormal gallbladder wall thickening 5 mm in single wall thickening. Sonographic Murphy's sign absent. Tumefactive sludge fills most of the gallbladder. Trace pericholecystic fluid along the anterior wall of the gallbladder. Common bile duct: Diameter: 0.3 cm Liver: No focal lesion identified. Coarse echogenic liver with poor sonic penetration compatible with diffuse hepatic  steatosis. Portal vein is patent on color Doppler imaging with normal direction of blood flow towards the liver. Other: None. IMPRESSION: 1. Abnormal gallbladder wall thickening with tumefactive sludge filling most of the gallbladder. Trace pericholecystic fluid along the anterior wall of the gallbladder. Sonographic Murphy's sign absent. Findings are equivocal for acute cholecystitis. 2. Coarse echogenic liver with poor sonic penetration compatible with diffuse hepatic steatosis. Electronically Signed   By: Ryan Salvage M.D.   On: 03/07/2024 11:35   CT Angio Chest PE W and/or Wo Contrast Result Date: 03/06/2024 CLINICAL DATA:  Chest pain while driving this afternoon, short of breath, diaphoresis, lower extremity numbness EXAM: CT ANGIOGRAPHY CHEST WITH CONTRAST TECHNIQUE: Multidetector CT imaging of the chest was performed using the standard protocol during bolus administration of  intravenous contrast. Multiplanar CT image reconstructions and MIPs were obtained to evaluate the vascular anatomy. RADIATION DOSE REDUCTION: This exam was performed according to the departmental dose-optimization program which includes automated exposure control, adjustment of the mA and/or kV according to patient size and/or use of iterative reconstruction technique. CONTRAST:  75mL OMNIPAQUE  IOHEXOL  350 MG/ML SOLN COMPARISON:  02/13/2024 FINDINGS: Cardiovascular: This is a technically adequate evaluation of the pulmonary vasculature. No filling defects or pulmonary emboli. Stable cardiomegaly and left ventricular dilatation. No pericardial effusion. No evidence of thoracic aortic aneurysm or dissection. Atherosclerosis of the aorta and coronary vasculature. Mediastinum/Nodes: No enlarged mediastinal, hilar, or axillary lymph nodes. Thyroid gland, trachea, and esophagus demonstrate no significant findings. Lungs/Pleura: Subpleural blebs within the bilateral apices. No acute airspace disease, effusion, or pneumothorax. The central airways are patent. Upper Abdomen: There is high density material within the gallbladder consistent with gallbladder sludge. Gallbladder wall is thickened, with pericholecystic fat stranding, consistent with acute cholecystitis. Please correlate with laboratory evaluation and physical exam findings. Musculoskeletal: No acute or destructive bony abnormalities. Reconstructed images demonstrate no additional findings. Review of the MIP images confirms the above findings. IMPRESSION: 1. Abnormal appearance of the gallbladder consistent with gallbladder sludge and acute cholecystitis. Please correlate with physical exam findings and laboratory evaluation. If further imaging is required, right upper quadrant ultrasound could be performed. 2. No evidence of pulmonary embolus. 3. Stable cardiomegaly and left ventricular dilatation. 4. Aortic Atherosclerosis (ICD10-I70.0). Coronary artery  atherosclerosis. Electronically Signed   By: Ozell Daring M.D.   On: 03/06/2024 19:14   DG Chest Port 1 View Result Date: 03/06/2024 CLINICAL DATA:  Chest pain, diaphoresis, shortness of breath EXAM: PORTABLE CHEST 1 VIEW COMPARISON:  02/13/2024 FINDINGS: Single frontal view of the chest demonstrates an unremarkable cardiac silhouette. No acute airspace disease, effusion, or pneumothorax. No acute bony abnormalities. IMPRESSION: 1. No acute intrathoracic process. Electronically Signed   By: Ozell Daring M.D.   On: 03/06/2024 16:26    ROS:  Pertinent items are noted in HPI.  Blood pressure (!) 172/92, pulse 61, temperature 97.9 F (36.6 C), temperature source Oral, resp. rate 12, height 5' 10 (1.778 m), weight 83 kg, SpO2 98%. Physical Exam: Pleasant white male in no acute distress Head is normocephalic, atraumatic Abdomen is soft with minimal tenderness in the right upper quadrant to palpation.  No rigidity is noted.  Previous discharge records reviewed  Assessment/Plan: Impression: Chest pain with minimal right upper quadrant abdominal pain.  His liver enzyme tests have drifted back higher since discharge.  White blood cell count is pending.  Patient has multiple comorbidities.  A more extensive cardiology workup is needed should the patient require surgical intervention.  I have ordered a HIDA  scan to assess for acute cholecystitis.  Should this be positive, IR placement of a cholecystostomy tube would be needed.  Patient would need to be transferred to [facility] for this to happen.  Further management is pending results.  Oneil Budge 03/08/2024, 9:52 AM

## 2024-03-08 NOTE — ED Provider Notes (Signed)
 Patient seen by cardiology and they recommended admission to [facility] with cardiac cath on Monday.  I spoke with general surgery Dr. Mavis and he suggested that the patient continue on Zosyn for his possible gallbladder problems and general surgery can be consulted at [facility] after his cardiac cath   Suzette Pac, MD 03/08/24 [phone]

## 2024-03-09 DIAGNOSIS — R7989 Other specified abnormal findings of blood chemistry: Secondary | ICD-10-CM

## 2024-03-09 DIAGNOSIS — I214 Non-ST elevation (NSTEMI) myocardial infarction: Secondary | ICD-10-CM | POA: Diagnosis not present

## 2024-03-09 DIAGNOSIS — F17210 Nicotine dependence, cigarettes, uncomplicated: Secondary | ICD-10-CM

## 2024-03-09 DIAGNOSIS — R0789 Other chest pain: Secondary | ICD-10-CM

## 2024-03-09 DIAGNOSIS — I429 Cardiomyopathy, unspecified: Secondary | ICD-10-CM | POA: Diagnosis not present

## 2024-03-09 DIAGNOSIS — R1010 Upper abdominal pain, unspecified: Secondary | ICD-10-CM

## 2024-03-09 DIAGNOSIS — I7143 Infrarenal abdominal aortic aneurysm, without rupture: Secondary | ICD-10-CM

## 2024-03-09 DIAGNOSIS — F1010 Alcohol abuse, uncomplicated: Secondary | ICD-10-CM

## 2024-03-09 DIAGNOSIS — I5043 Acute on chronic combined systolic (congestive) and diastolic (congestive) heart failure: Secondary | ICD-10-CM

## 2024-03-09 DIAGNOSIS — I5023 Acute on chronic systolic (congestive) heart failure: Secondary | ICD-10-CM | POA: Diagnosis not present

## 2024-03-09 DIAGNOSIS — I10 Essential (primary) hypertension: Secondary | ICD-10-CM | POA: Diagnosis not present

## 2024-03-09 LAB — CBC
HCT: 41.7 % (ref 39.0–52.0)
Hemoglobin: 14.3 g/dL (ref 13.0–17.0)
MCH: 35.7 pg — ABNORMAL HIGH (ref 26.0–34.0)
MCHC: 34.3 g/dL (ref 30.0–36.0)
MCV: 104 fL — ABNORMAL HIGH (ref 80.0–100.0)
Platelets: 169 K/uL (ref 150–400)
RBC: 4.01 MIL/uL — ABNORMAL LOW (ref 4.22–5.81)
RDW: 13.4 % (ref 11.5–15.5)
WBC: 6 K/uL (ref 4.0–10.5)
nRBC: 0 % (ref 0.0–0.2)

## 2024-03-09 LAB — COMPREHENSIVE METABOLIC PANEL WITH GFR
ALT: 199 U/L — ABNORMAL HIGH (ref 0–44)
AST: 109 U/L — ABNORMAL HIGH (ref 15–41)
Albumin: 2.6 g/dL — ABNORMAL LOW (ref 3.5–5.0)
Alkaline Phosphatase: 154 U/L — ABNORMAL HIGH (ref 38–126)
Anion gap: 12 (ref 5–15)
BUN: 7 mg/dL (ref 6–20)
CO2: 24 mmol/L (ref 22–32)
Calcium: 8.4 mg/dL — ABNORMAL LOW (ref 8.9–10.3)
Chloride: 99 mmol/L (ref 98–111)
Creatinine, Ser: 0.76 mg/dL (ref 0.61–1.24)
GFR, Estimated: >60 mL/min (ref >60)
Glucose, Bld: 104 mg/dL — ABNORMAL HIGH (ref 70–99)
Potassium: 3.4 mmol/L — ABNORMAL LOW (ref 3.5–5.1)
Sodium: 135 mmol/L (ref 135–145)
Total Bilirubin: 0.7 mg/dL (ref 0.0–1.2)
Total Protein: 5.5 g/dL — ABNORMAL LOW (ref 6.5–8.1)

## 2024-03-09 LAB — RAPID URINE DRUG SCREEN, HOSP PERFORMED
Amphetamines: NOT DETECTED
Barbiturates: NOT DETECTED
Benzodiazepines: NOT DETECTED
Cocaine: NOT DETECTED
Opiates: NOT DETECTED
Tetrahydrocannabinol: NOT DETECTED

## 2024-03-09 LAB — HEPARIN LEVEL (UNFRACTIONATED): Heparin Unfractionated: 0.1 [IU]/mL — ABNORMAL LOW (ref 0.30–0.70)

## 2024-03-09 LAB — MAGNESIUM: Magnesium: 1.5 mg/dL — ABNORMAL LOW (ref 1.7–2.4)

## 2024-03-09 MED ORDER — HEPARIN BOLUS VIA INFUSION
2000.0000 [IU] | Freq: Once | INTRAVENOUS | Status: AC
  Administered 2024-03-09: 2000 [IU] via INTRAVENOUS
  Filled 2024-03-09: qty 2000

## 2024-03-09 MED ORDER — POTASSIUM CHLORIDE CRYS ER 20 MEQ PO TBCR
40.0000 meq | EXTENDED_RELEASE_TABLET | Freq: Once | ORAL | Status: AC
  Administered 2024-03-09: 40 meq via ORAL
  Filled 2024-03-09: qty 2

## 2024-03-09 MED ORDER — HEPARIN (PORCINE) 25000 UT/250ML-% IV SOLN
1650.0000 [IU]/h | INTRAVENOUS | Status: DC
  Administered 2024-03-09: 950 [IU]/h via INTRAVENOUS
  Administered 2024-03-10: 1400 [IU]/h via INTRAVENOUS
  Administered 2024-03-11: 1650 [IU]/h via INTRAVENOUS
  Filled 2024-03-09 (×3): qty 250

## 2024-03-09 NOTE — Progress Notes (Signed)
 PHARMACY - ANTICOAGULATION CONSULT NOTE  Pharmacy Consult for Heparin  Indication: NSTEMI  No Known Allergies  Patient Measurements: Height: 5' 10 (177.8 cm) Weight: 77.3 kg (170 lb 6.7 oz) IBW/kg (Calculated) : 73 HEPARIN  DW (KG): 77.2  Vital Signs: Temp: 98.4 F (36.9 C) (09/13 0734) Temp Source: Oral (09/13 0734) BP: 109/78 (09/13 0734) Pulse Rate: 90 (09/13 0734)  Labs: Recent Labs    03/07/24 0430 03/07/24 0437 03/08/24 0903 03/08/24 1044 03/09/24 1819  HGB 12.6*  --  14.2  --   --   HCT 37.4*  --  41.1  --   --   PLT 193  --  183  --   --   LABPROT  --   --  12.5  --   --   INR  --   --  0.9  --   --   HEPARINUNFRC  --   --   --   --  0.10*  CREATININE 0.63  --  0.62  --   --   TROPONINIHS  --  18* 18* 18*  --     Estimated Creatinine Clearance: 109 mL/min (by C-G formula based on SCr of 0.62 mg/dL).   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 8 tablet 0 Unknown   aspirin  EC 81 MG tablet Take 1 tablet (81 mg total) by mouth daily with breakfast. Swallow whole. 30 tablet 11 03/08/2024 at  5:00 AM   folic acid  (FOLVITE ) 1 MG tablet Take 1 tablet (1 mg total) by mouth daily. 90 tablet 3 03/08/2024 Morning   ibuprofen  (ADVIL ) 200 MG tablet Take 400-600 mg by mouth every 6 (six) hours as needed for mild pain (pain score 1-3).   03/06/2024   metoprolol  tartrate (LOPRESSOR ) 25 MG tablet Take 1 tablet (25 mg total) by mouth 2 (two) times daily. 180 tablet 3 03/08/2024 Morning   Misc Natural Products (NEURIVA PO) Take 1 capsule by mouth daily. For nerve pain   03/06/2024   nicotine  (NICODERM CQ  - DOSED IN MG/24 HOURS) 21 mg/24hr patch Place 1 patch (21 mg total) onto the skin daily. 28  patch 0 Unknown   traZODone  (DESYREL ) 150 MG tablet Take 1 tablet (150 mg total) by mouth at bedtime. 90 tablet 1 03/04/2024   Scheduled:   amoxicillin -clavulanate  1 tablet Oral BID   aspirin  EC  81 mg Oral Q breakfast   carvedilol   3.125 mg Oral BID   folic acid   1 mg Oral Daily   furosemide   40 mg Intravenous Daily   heparin   2,000 Units Intravenous Once   nicotine   21 mg Transdermal Daily   sacubitril -valsartan   1 tablet Oral BID   thiamine   100 mg Oral Daily   Infusions:   heparin  950 Units/hr (03/09/24 1211)   PRN: albuterol , hydrALAZINE , morphine  injection, oxyCODONE , zolpidem   Assessment: 53 YO M with PMH significant for alcohol abuse, tobacco abuse, CAD, hypertension, hyperlipidemia, PAD presents to [facility] for NSTEMI with EKG illustrating inferolateral ST depressions and reduced LVEF with regional wall motion abnormalities.  Of note, patient has received a dose of 5,000 units of heparin  for DVT prophylaxis this AM before switching to heparin  drip; therefore will not bolus at  this time and will start the heparin  infusion now.   Hgb 14.2, Hct 41.1, Plt 183  PM update: HL 0.1 on 950 units/hr. No issues with the infusion or bleeding reported per RN.  Goal of Therapy:  Heparin  level 0.3-0.7 units/ml Monitor platelets by anticoagulation protocol: Yes   Plan:  Heparin  2000 units IV bolus x 1 Increase heparin  infusion to 1150 units/hr Check anti-xa level in 6hr and daily while on heparin  Continue to monitor H&H and platelets  Rocky Slade, PharmD, BCPS Please refer to [facility] for [facility] numbers 03/09/2024 6:56 PM

## 2024-03-09 NOTE — Progress Notes (Signed)
 PHARMACY - ANTICOAGULATION CONSULT NOTE  Pharmacy Consult for Heparin  Indication: NSTEMI  No Known Allergies  Patient Measurements: Height: 5' 10 (177.8 cm) Weight: 77.3 kg (170 lb 6.7 oz) IBW/kg (Calculated) : 73 HEPARIN  DW (KG): 77.2  Vital Signs: Temp: 98.4 F (36.9 C) (09/13 0734) Temp Source: Oral (09/13 0734) BP: 109/78 (09/13 0734) Pulse Rate: 90 (09/13 0734)  Labs: Recent Labs    03/06/24 1458 03/06/24 1728 03/07/24 0430 03/07/24 0437 03/08/24 0903 03/08/24 1044  HGB 14.6  --  12.6*  --  14.2  --   HCT 41.3  --  37.4*  --  41.1  --   PLT 210  --  193  --  183  --   LABPROT  --   --   --   --  12.5  --   INR  --   --   --   --  0.9  --   CREATININE 0.85  --  0.63  --  0.62  --   TROPONINIHS 18*   < >  --  18* 18* 18*   < > = values in this interval not displayed.    Estimated Creatinine Clearance: 109 mL/min (by C-G formula based on SCr of 0.62 mg/dL).   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 8 tablet 0 Unknown   aspirin  EC 81 MG tablet Take 1 tablet (81 mg total) by mouth daily with breakfast. Swallow whole. 30 tablet 11 03/08/2024 at  5:00 AM   folic acid  (FOLVITE ) 1 MG tablet Take 1 tablet (1 mg total) by mouth daily. 90 tablet 3 03/08/2024 Morning   ibuprofen  (ADVIL ) 200 MG tablet Take 400-600 mg by mouth every 6 (six) hours as needed for mild pain (pain score 1-3).   03/06/2024   metoprolol  tartrate (LOPRESSOR ) 25 MG tablet Take 1 tablet (25 mg total) by mouth 2 (two) times daily. 180 tablet 3 03/08/2024 Morning   Misc Natural Products (NEURIVA PO) Take 1 capsule by mouth daily. For nerve pain   03/06/2024   nicotine  (NICODERM CQ  - DOSED IN MG/24 HOURS) 21 mg/24hr  patch Place 1 patch (21 mg total) onto the skin daily. 28 patch 0 Unknown   traZODone  (DESYREL ) 150 MG tablet Take 1 tablet (150 mg total) by mouth at bedtime. 90 tablet 1 03/04/2024   Scheduled:   amoxicillin -clavulanate  1 tablet Oral BID   aspirin  EC  81 mg Oral Q breakfast   carvedilol   3.125 mg Oral BID   folic acid   1 mg Oral Daily   furosemide   40 mg Intravenous Daily   nicotine   21 mg Transdermal Daily   sacubitril -valsartan   1 tablet Oral BID   thiamine   100 mg Oral Daily   Infusions:  PRN: albuterol , hydrALAZINE , morphine  injection, oxyCODONE , zolpidem   Assessment: 53 YO M with PMH significant for alcohol abuse, tobacco abuse, CAD, hypertension, hyperlipidemia, PAD presents to [facility] for NSTEMI with EKG illustrating inferolateral ST depressions and reduced LVEF with regional wall motion abnormalities.  Of note, patient has received a dose of 5,000 units of heparin  for DVT prophylaxis this AM before switching to heparin  drip; therefore will not bolus at this time and will start the  heparin  infusion now.   Hgb 14.2, Hct 41.1, Plt 183  Goal of Therapy:  Heparin  level 0.3-0.7 units/ml Monitor platelets by anticoagulation protocol: Yes   Plan:  Start heparin  infusion at 950 units/hr Check anti-Xa level in 6 hours and daily while on heparin  Continue to monitor H&H and platelets  R. Samual Satterfield, PharmD PGY-1 Acute Care Pharmacy Resident [facility] Please refer to [facility] Pharmacy numbers 03/09/2024 10:54 AM

## 2024-03-09 NOTE — Progress Notes (Signed)
  Progress Note  Patient Name: Maxwell Rosales. Date of Encounter: 03/09/2024 [facility] Cardiologist: None Vishnu Priya Mallipeddi, MD   Interval Summary   Patient seen and examined at bedside. Denies anginal chest pain. No family present at bedside.  Vital Signs Vitals:   03/08/24 2100 03/08/24 2335 03/09/24 0302 03/09/24 0734  BP:  113/82 130/74 109/78  Pulse:    90  Resp:    18  Temp:  98.2 F (36.8 C) 98.2 F (36.8 C) 98.4 F (36.9 C)  TempSrc:  Oral Oral Oral  SpO2:    98%  Weight: 77.2 kg  77.3 kg   Height: 5' 10 (1.778 m)       Intake/Output Summary (Last 24 hours) at 03/09/2024 0853 Last data filed at 03/08/2024 2341 Gross per 24 hour  Intake 740 ml  Output 225 ml  Net 515 ml      03/09/2024    3:02 AM 03/08/2024    9:00 PM 03/08/2024    8:27 AM  Last 3 Weights  Weight (lbs) 170 lb 6.7 oz 170 lb 4.8 oz 182 lb 15.7 oz  Weight (kg) 77.3 kg 77.248 kg 83 kg      Telemetry/ECG  Sinus rhythm with rare PVCs- Personally Reviewed  Physical Exam  GEN: No acute distress.  Hemodynamically stable, Neck: No JVD Cardiac: RRR, no murmurs, rubs, or gallops.  Respiratory: Decreased breath sounds bilaterally, no wheezes rales or rhonchi's. GI: Soft, nontender, non-distended  MS: No edema  Assessment & Plan  NSTEMI Precordial pain with mixed features, mild elevation in troponins, EKG illustrates inferolateral ST depressions, EKG notes reduced LVEF with regional wall motion normalities. Currently chest pain-free. Hemodynamically stable. Start IV heparin  drip per ACS protocol. Continue aspirin  81 mg p.o. daily. AST and ALT are elevated, will continue to trend -see consult note, recommended starting atorvastatin 40 mg p.o. nightly, started 20 mg p.o. nightly.  Continue to trend AST and ALT.  Hopeful that they should improve as he has stopped drinking alcohol for the last 3 weeks.  Acute on chronic systolic and diastolic heart failure. Newly discovered  cardiomyopathy, likely ischemic LVEF 35-40% Continue carvedilol  12.5 mg p.o. twice daily. Continue IV Lasix  40 mg IV push daily. Continue Entresto  24/26 mg p.o. twice daily Net IO Since Admission: 515 mL [03/09/24 1010]  Hypertension: Improving after uptitration of GDMT. Monitor for now  PAD: - Moderate PAD by ABI (R ABI 0.79 and L ABI 0.75).  Imaging from August 2025 showed near occlusive stenosis of the aortic bifurcation and origins of the internal iliac arteries bilaterally.  He will need peripheral angiogram sooner.  - Continue aspirin  81 mg once daily. - Start rosuvastatin  20 mg nightly. - Has an appointment with vascular surgery on April 10, 2024.  Alcohol induced liver injury: LFTs have remained around 100-200s Has been drinking alcohol for 20 years. 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 Surveillance every 3 years, follow-up as outpatient with PCP/vascular surgery    For questions or updates, please contact  [facility] Please consult [email or web address] for contact info under         Signed, Kurtis Anastasia, DO

## 2024-03-09 NOTE — Progress Notes (Signed)
 Heart Failure Navigator Progress Note  [facility] patient.  Reached out to Dr. Fairy Sermon by secure chat. Inquired if he wanted me to reach out to the patient today prior in regards to this patient EF 35-40%.  He responded that the cardiologist consult was being done now.  Will wait at this time until cardiology consult completed.  Navigator available for reassessment of patient.   Charmaine Pines, RN, BSN [facility] Heart Failure Navigator Secure Chat Only

## 2024-03-09 NOTE — Progress Notes (Signed)
 [facility]                                                                              Maxwell Rosales, is a 53 y.o. male, DOB - [birth year], FMW:[id number] Admit date - 03/08/2024    Outpatient Primary MD for the patient is Glennon Sand, NP  LOS - 1  days  Chief Complaint  Patient presents with   Abdominal Pain       Brief summary   Patient is a 53 year old male with history of alcohol use, quit 3 weeks ago, tobacco use, CAD, HTN, HLP, PAD, recent hospitalization and discharged on 8/23 due to alcohol withdrawals, another admission on 9/11 for chest pain thought secondary to acute cholecystitis, workup showed a 2D echo with EF of 35% with regional wall motion abnormalities. Patient presented to [facility] ED with shortness of breath and chest pressure that woke him up from sleep, reported dyspnea on exertion.  No fever or chills, no leg extremity edema or hemoptysis.   In ED, BP 170/90, labs were significant for worsening LFTs, AST 251, ALT 244, total bili 1.4, seen by general surgery, HIDA scan no concern of acute cholecystitis, troponins mildly elevated at 18, EKG showing ST depression in inferior and lateral leads, cardiology were consulted who recommended transfer to [facility] for cardiac cath on Monday.    Assessment & Plan    Chest pain with NSTEMI,  New ischemic cardiomyopathy, EF 35 to 40% - Presented with chest pain, mild elevation in troponin with EKG showing inferior lateral ST depressions. - Currently on IV heparin  drip, continue aspirin  81 mg daily, beta-blocker - Continue IV Lasix , plan for cardiac cath on Monday 9/15, cardiology following     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 failure) (HCC) New cardiomyopathy with a EF 35 to 40%, likely ischemic cardiomyopathy - Continue IV Lasix , strict I's and O's and daily weights - Continue Coreg , Entresto  - Plan for cardiac cath on Monday 9/15 per  cardiology     Hypertension, poorly controlled -Continue with Coreg , Entresto , as needed hydralazine    Transaminitis Fatty liver Acute cholecystitis? - Recent admission with CT chest and ultrasound with some concern for acute cholecystitis -Seen by general surgery at [facility], recommended extensive cardiology workup should the patient require surgical intervention, recommended HIDA - HIDA scan normal, with filling of gallbladder indicating cystic duct patency - LFTs worsening, will hold statin, possibly related to hepatic congestion from heart failure - Will trend LFTs, if worsening abdominal pain, fevers or any cholangitis features, will discuss with general surgery    Severe PAD - Diagnosed with PAD, was supposed to follow-up with Dr. Silver on 9/11  - Appointment has been rescheduled with Dr. Gretta on 10/15.  Discussed with the patient and he wants to hold off vascular surgery consult given need for cardiac workup.   Tobacco abuse -counseled to continue with nicotine  patch   Infrarenal AAA 3.4 cm -Will need surveillance every 3 years   Alcohol abuse -Still abstinence from alcohol since recently discharged on 8/23, so no concern for withdrawals - Continue thiamine  and folic acid   Estimated body mass index is 24.45 kg/m as calculated from the following:   Height as of this encounter: 5' 10 (1.778 m).   Weight as of this encounter: 77.3 kg.  Code Status: Full code DVT Prophylaxis:  SCDs Start: 03/08/24 1733   Level of Care: Level of care: Telemetry Cardiac Family Communication: Updated patient Disposition Plan:      Remains inpatient appropriate: Plan for cardiac cath on Monday   Procedures:  2D echo HIDA scan Consultants:   General Surgery Cardiology  Antimicrobials:   Anti-infectives (From admission, onward)    Start     Dose/Rate Route Frequency Ordered Stop   03/08/24 2200  amoxicillin -clavulanate (AUGMENTIN ) 875-125 MG per tablet 1 tablet        1  tablet Oral 2 times daily 03/08/24 1732            Medications  amoxicillin -clavulanate  1 tablet Oral BID   aspirin  EC  81 mg Oral Q breakfast   carvedilol   3.125 mg Oral BID   folic acid   1 mg Oral Daily   furosemide   40 mg Intravenous Daily   nicotine   21 mg Transdermal Daily   sacubitril -valsartan   1 tablet Oral BID   thiamine   100 mg Oral Daily      Subjective:   Maxwell Rosales was seen and examined today.  Denies any acute chest pain or shortness of breath.  No fever chills no acute abdominal pain.   Objective:   Vitals:   03/08/24 2100 03/08/24 2335 03/09/24 0302 03/09/24 0734  BP:  113/82 130/74 109/78  Pulse:    90  Resp:    18  Temp:  98.2 F (36.8 C) 98.2 F (36.8 C) 98.4 F (36.9 C)  TempSrc:  Oral Oral Oral  SpO2:    98%  Weight: 77.2 kg  77.3 kg   Height: 5' 10 (1.778 m)       Intake/Output Summary (Last 24 hours) at 03/09/2024 1109 Last data filed at 03/08/2024 2341 Gross per 24 hour  Intake 740 ml  Output 225 ml  Net 515 ml     Wt Readings from Last 3 Encounters:  03/09/24 77.3 kg  03/06/24 83 kg  03/01/24 83 kg     Exam General: Alert and oriented x 3, NAD Cardiovascular: S1 S2 auscultated,  RRR Respiratory: Decreased BS at the bases, no wheezing Gastrointestinal: Soft, nontender, nondistended, + bowel sounds Ext: no pedal edema bilaterally Neuro: No new deficits Psych: Normal affect     Data Reviewed:  I have personally reviewed following labs    CBC Lab Results  Component Value Date   WBC 10.2 03/08/2024   RBC 3.95 (L) 03/08/2024   HGB 14.2 03/08/2024   HCT 41.1 03/08/2024   MCV 104.1 (H) 03/08/2024   MCH 35.9 (H) 03/08/2024   PLT 183 03/08/2024   MCHC 34.5 03/08/2024   RDW 13.3 03/08/2024   LYMPHSABS 1.3 03/08/2024   MONOABS 0.6 03/08/2024   EOSABS 0.1 03/08/2024   BASOSABS 0.1 03/08/2024     Last metabolic panel Lab Results  Component Value Date   NA 134 (L) 03/08/2024   K 3.7 03/08/2024   CL 101  03/08/2024   CO2 24 03/08/2024   BUN 8 03/08/2024   CREATININE 0.62 03/08/2024   GLUCOSE 117 (H) 03/08/2024   GFRNONAA >60 03/08/2024   CALCIUM  9.3 03/08/2024   PROT 6.2 (L) 03/08/2024   ALBUMIN 2.9 (L) 03/08/2024   BILITOT 1.4 (H) 03/08/2024   ALKPHOS  178 (H) 03/08/2024   AST 251 (H) 03/08/2024   ALT 244 (H) 03/08/2024   ANIONGAP 9 03/08/2024    CBG (last 3)  No results for input(s): GLUCAP in the last 72 hours.    Coagulation Profile: Recent Labs  Lab 03/08/24 0903  INR 0.9     Radiology Studies: I have personally reviewed the imaging studies  NM Hepatobiliary Liver Func Result Date: 03/08/2024 CLINICAL DATA:  Cholelithiasis.  Abdominal discomfort EXAM: NUCLEAR MEDICINE HEPATOBILIARY IMAGING TECHNIQUE: Sequential images of the abdomen were obtained out to 60 minutes following intravenous administration of radiopharmaceutical. RADIOPHARMACEUTICALS:  4.7 mCi Tc-10m  Choletec  IV COMPARISON:  Abdominal ultrasound 03/07/2024 FINDINGS: Prompt uptake of radiopharmaceutical from the blood pool. Biliary activity faintly visible at 10 minutes with bowel activity at 17 minutes. Gallbladder activity first becomes visible at 25 minutes. This indicates cystic duct patency and common bile duct patency. IMPRESSION: 1. Normal hepatobiliary scan, with filling of the gallbladder indicating cystic duct patency. Electronically Signed   By: Ryan Salvage M.D.   On: 03/08/2024 15:36   ECHOCARDIOGRAM COMPLETE Result Date: 03/07/2024    ECHOCARDIOGRAM REPORT   Patient Name:   Maxwell Rosales. Date of Exam: 03/07/2024 Medical Rec #:  [id number]             Height:       70.0 in Accession #:    [id number]            Weight:       183.0 lb Date of Birth:  [birth year]              BSA:          2.010 m Patient Age:    53 years              BP:           135/78 mmHg Patient Gender: M                     HR:           82 bpm. Exam Location:  [facility] Procedure: 2D Echo, 3D Echo, Cardiac Doppler, Color  Doppler and Strain Analysis            (Both Spectral and Color Flow Doppler were utilized during            procedure). Indications:    Chest Pain R07.9  History:        Patient has no prior history of Echocardiogram examinations.                 Arrythmias:LBBB; Risk Factors:Hypertension and Current Smoker.  Sonographer:    Aida Pizza RCS Referring Phys: [phone] DAWOOD GORMAN LYE  Sonographer Comments: Global longitudinal strain was attempted. IMPRESSIONS  1. Left ventricular ejection fraction, by estimation, is 35 to 40%. Left ventricular ejection fraction by 3D volume is 40 %. The left ventricle has moderately decreased function. The left ventricle demonstrates regional wall motion abnormalities (see scoring diagram/findings for description). The left ventricular internal cavity size was moderately to severely dilated. Left ventricular diastolic parameters are consistent with Grade I diastolic dysfunction (impaired relaxation). Elevated left ventricular end-diastolic pressure. The average left ventricular global longitudinal strain is -11.0 %. The global longitudinal strain is abnormal.  2. Right ventricular systolic function is normal. The right ventricular size is normal. Tricuspid regurgitation signal is inadequate for assessing PA pressure.  3. The mitral valve is normal in structure. Trivial mitral valve  regurgitation. No evidence of mitral stenosis.  4. The aortic valve is tricuspid. Aortic valve regurgitation is not visualized. No aortic stenosis is present.  5. The inferior vena cava is dilated in size with >50% respiratory variability, suggesting right atrial pressure of 8 mmHg. Comparison(s): No prior Echocardiogram. FINDINGS  Left Ventricle: Left ventricular ejection fraction, by estimation, is 35 to 40%. Left ventricular ejection fraction by 3D volume is 40 %. The left ventricle has moderately decreased function. The left ventricle demonstrates regional wall motion abnormalities. The average left  ventricular global longitudinal strain is -11.0 %. Strain was performed and the global longitudinal strain is abnormal. The left ventricular internal cavity size was moderately to severely dilated. There is no left ventricular hypertrophy. Left ventricular diastolic parameters are consistent with Grade I diastolic dysfunction (impaired relaxation). Elevated left ventricular end-diastolic pressure.  LV Wall Scoring: The entire lateral wall, inferior wall, and basal inferoseptal segment are akinetic. The anterior septum, mid inferoseptal segment, and basal anterior segment are hypokinetic. The mid and distal anterior wall, apical septal segment, apical inferior segment, and apex are normal. Right Ventricle: The right ventricular size is normal. No increase in right ventricular wall thickness. Right ventricular systolic function is normal. Tricuspid regurgitation signal is inadequate for assessing PA pressure. Left Atrium: Left atrial size was normal in size. Right Atrium: Right atrial size was normal in size. Pericardium: There is no evidence of pericardial effusion. Mitral Valve: The mitral valve is normal in structure. Trivial mitral valve regurgitation. No evidence of mitral valve stenosis. Tricuspid Valve: The tricuspid valve is normal in structure. Tricuspid valve regurgitation is trivial. No evidence of tricuspid stenosis. Aortic Valve: The aortic valve is tricuspid. Aortic valve regurgitation is not visualized. No aortic stenosis is present. Pulmonic Valve: The pulmonic valve was normal in structure. Pulmonic valve regurgitation is not visualized. No evidence of pulmonic stenosis. Aorta: The aortic root is normal in size and structure. Venous: The inferior vena cava is dilated in size with greater than 50% respiratory variability, suggesting right atrial pressure of 8 mmHg. IAS/Shunts: No atrial level shunt detected by color flow Doppler. Additional Comments: 3D was performed not requiring image post processing  on an independent workstation and was abnormal.  LEFT VENTRICLE PLAX 2D LVIDd:         6.65 cm         Diastology LVIDs:         5.30 cm         LV e' medial:    4.21 cm/s LV PW:         1.15 cm         LV E/e' medial:  17.4 LV IVS:        1.05 cm         LV e' lateral:   5.52 cm/s LVOT diam:     1.80 cm         LV E/e' lateral: 13.2 LV SV:         58 LV SV Index:   29              2D Longitudinal LVOT Area:     2.54 cm        Strain                                2D Strain GLS   -11.0 %  Avg: LV Volumes (MOD) LV vol d, MOD    177.0 ml      3D Volume EF A2C:                           LV 3D EF:    Left LV vol d, MOD    240.0 ml                   ventricul A4C:                                        ar LV vol s, MOD    112.0 ml                   ejection A2C:                                        fraction LV vol s, MOD    142.0 ml                   by 3D A4C:                                        volume is LV SV MOD A2C:   65.0 ml                    40 %. LV SV MOD A4C:   240.0 ml LV SV MOD BP:    85.9 ml                                3D Volume EF:                                3D EF:        40 %                                LV EDV:       226 ml                                LV ESV:       137 ml                                LV SV:        90 ml RIGHT VENTRICLE RV S prime:     12.60 cm/s TAPSE (M-mode): 2.0 cm LEFT ATRIUM             Index        RIGHT ATRIUM           Index LA diam:        4.40 cm 2.19 cm/m   RA Area:     15.10 cm LA Vol (A2C):   46.2 ml 22.99 ml/m  RA Volume:   37.70 ml  18.76 ml/m LA Vol (A4C):   68.1 ml 33.88 ml/m LA Biplane Vol: 57.0 ml 28.36 ml/m  AORTIC VALVE LVOT Vmax:   107.00 cm/s LVOT Vmean:  66.500 cm/s LVOT VTI:    0.227 m  AORTA Ao Root diam: 3.40 cm MITRAL VALVE MV Area (PHT): 2.69 cm       SHUNTS MV Decel Time: 282 msec       Systemic VTI:  0.23 m MR Peak grad:    65.6 mmHg    Systemic Diam: 1.80 cm MR Mean grad:    42.0 mmHg MR Vmax:          405.00 cm/s MR Vmean:        304.0 cm/s MR PISA:         2.26 cm MR PISA Eff ROA: 12 mm MR PISA Radius:  0.60 cm MV E velocity: 73.10 cm/s MV A velocity: 94.40 cm/s MV E/A ratio:  0.77 Vishnu Priya Mallipeddi Electronically signed by Diannah Late Mallipeddi Signature Date/Time: 03/07/2024/4:26:41 PM    Final    US  Abdomen Limited RUQ (LIVER/GB) Result Date: 03/07/2024 CLINICAL DATA:  Acute cholecystitis EXAM: ULTRASOUND ABDOMEN LIMITED RIGHT UPPER QUADRANT COMPARISON:  02/13/2024 CT scan FINDINGS: Gallbladder: Abnormal gallbladder wall thickening 5 mm in single wall thickening. Sonographic Murphy's sign absent. Tumefactive sludge fills most of the gallbladder. Trace pericholecystic fluid along the anterior wall of the gallbladder. Common bile duct: Diameter: 0.3 cm Liver: No focal lesion identified. Coarse echogenic liver with poor sonic penetration compatible with diffuse hepatic steatosis. Portal vein is patent on color Doppler imaging with normal direction of blood flow towards the liver. Other: None. IMPRESSION: 1. Abnormal gallbladder wall thickening with tumefactive sludge filling most of the gallbladder. Trace pericholecystic fluid along the anterior wall of the gallbladder. Sonographic Murphy's sign absent. Findings are equivocal for acute cholecystitis. 2. Coarse echogenic liver with poor sonic penetration compatible with diffuse hepatic steatosis. Electronically Signed   By: Ryan Salvage M.D.   On: 03/07/2024 11:35       Mckaylee Dimalanta M.D. [facility] 03/09/2024, 11:09 AM  Available via Epic secure chat 7am-7pm After 7 pm, please refer to night coverage provider listed on amion.

## 2024-03-10 DIAGNOSIS — I5023 Acute on chronic systolic (congestive) heart failure: Secondary | ICD-10-CM | POA: Diagnosis not present

## 2024-03-10 DIAGNOSIS — R1010 Upper abdominal pain, unspecified: Secondary | ICD-10-CM | POA: Diagnosis not present

## 2024-03-10 DIAGNOSIS — I4729 Other ventricular tachycardia: Secondary | ICD-10-CM

## 2024-03-10 DIAGNOSIS — R7989 Other specified abnormal findings of blood chemistry: Secondary | ICD-10-CM | POA: Diagnosis not present

## 2024-03-10 DIAGNOSIS — I214 Non-ST elevation (NSTEMI) myocardial infarction: Secondary | ICD-10-CM | POA: Diagnosis not present

## 2024-03-10 DIAGNOSIS — I5043 Acute on chronic combined systolic (congestive) and diastolic (congestive) heart failure: Secondary | ICD-10-CM | POA: Diagnosis not present

## 2024-03-10 DIAGNOSIS — R0789 Other chest pain: Secondary | ICD-10-CM | POA: Diagnosis not present

## 2024-03-10 DIAGNOSIS — I429 Cardiomyopathy, unspecified: Secondary | ICD-10-CM | POA: Diagnosis not present

## 2024-03-10 LAB — CBC
HCT: 44.4 % (ref 39.0–52.0)
Hemoglobin: 15.1 g/dL (ref 13.0–17.0)
MCH: 35.3 pg — ABNORMAL HIGH (ref 26.0–34.0)
MCHC: 34 g/dL (ref 30.0–36.0)
MCV: 103.7 fL — ABNORMAL HIGH (ref 80.0–100.0)
Platelets: 175 K/uL (ref 150–400)
RBC: 4.28 MIL/uL (ref 4.22–5.81)
RDW: 13.6 % (ref 11.5–15.5)
WBC: 16.3 K/uL — ABNORMAL HIGH (ref 4.0–10.5)
nRBC: 0 % (ref 0.0–0.2)

## 2024-03-10 LAB — COMPREHENSIVE METABOLIC PANEL WITH GFR
ALT: 161 U/L — ABNORMAL HIGH (ref 0–44)
AST: 106 U/L — ABNORMAL HIGH (ref 15–41)
Albumin: 2.7 g/dL — ABNORMAL LOW (ref 3.5–5.0)
Alkaline Phosphatase: 174 U/L — ABNORMAL HIGH (ref 38–126)
Anion gap: 11 (ref 5–15)
BUN: 8 mg/dL (ref 6–20)
CO2: 25 mmol/L (ref 22–32)
Calcium: 9 mg/dL (ref 8.9–10.3)
Chloride: 98 mmol/L (ref 98–111)
Creatinine, Ser: 0.8 mg/dL (ref 0.61–1.24)
GFR, Estimated: >60 mL/min (ref >60)
Glucose, Bld: 100 mg/dL — ABNORMAL HIGH (ref 70–99)
Potassium: 3.9 mmol/L (ref 3.5–5.1)
Sodium: 134 mmol/L — ABNORMAL LOW (ref 135–145)
Total Bilirubin: 1 mg/dL (ref 0.0–1.2)
Total Protein: 5.8 g/dL — ABNORMAL LOW (ref 6.5–8.1)

## 2024-03-10 LAB — HEPARIN LEVEL (UNFRACTIONATED)
Heparin Unfractionated: 0.15 [IU]/mL — ABNORMAL LOW (ref 0.30–0.70)
Heparin Unfractionated: 0.23 [IU]/mL — ABNORMAL LOW (ref 0.30–0.70)
Heparin Unfractionated: <0.1 [IU]/mL — ABNORMAL LOW (ref 0.30–0.70)
Heparin Unfractionated: 0.22 [IU]/mL — ABNORMAL LOW (ref 0.30–0.70)

## 2024-03-10 MED ORDER — FREE WATER
250.0000 mL | Freq: Once | Status: AC
Start: 2024-03-11 — End: 2024-03-11
  Administered 2024-03-11: 250 mL via ORAL

## 2024-03-10 MED ORDER — MAGNESIUM SULFATE 2 GM/50ML IV SOLN
2.0000 g | Freq: Once | INTRAVENOUS | Status: AC
  Administered 2024-03-10: 2 g via INTRAVENOUS
  Filled 2024-03-10: qty 50

## 2024-03-10 MED ORDER — HEPARIN BOLUS VIA INFUSION
1000.0000 [IU] | Freq: Once | INTRAVENOUS | Status: AC
Start: 2024-03-10 — End: 2024-03-10
  Administered 2024-03-10: 1000 [IU] via INTRAVENOUS
  Filled 2024-03-10: qty 1000

## 2024-03-10 MED ORDER — HEPARIN BOLUS VIA INFUSION
2000.0000 [IU] | Freq: Once | INTRAVENOUS | Status: AC
  Administered 2024-03-10: 2000 [IU] via INTRAVENOUS
  Filled 2024-03-10: qty 2000

## 2024-03-10 MED ORDER — NITROGLYCERIN 0.4 MG SL SUBL
0.4000 mg | SUBLINGUAL_TABLET | SUBLINGUAL | Status: DC | PRN
  Administered 2024-03-10: 0.4 mg via SUBLINGUAL

## 2024-03-10 MED ORDER — NITROGLYCERIN 0.4 MG SL SUBL
SUBLINGUAL_TABLET | SUBLINGUAL | Status: AC
  Filled 2024-03-10: qty 1

## 2024-03-10 NOTE — Progress Notes (Signed)
 Progress Note  Patient Name: Maxwell Rosales. Date of Encounter: 03/10/2024 [facility] Cardiologist: None Vishnu Priya Mallipeddi, MD   Interval Summary   No active chest pain. Had an episode of chest pain overnight responsive to nitroglycerin  Had 1 asymptomatic episode of NSVT yesterday, 11 beats  Vital Signs Vitals:   03/10/24 0236 03/10/24 0434 03/10/24 0500 03/10/24 0719  BP:  102/72  103/68  Pulse: 95 81  72  Resp:  17  18  Temp: 98.5 F (36.9 C) 98.4 F (36.9 C)  98.5 F (36.9 C)  TempSrc: Oral Oral  Oral  SpO2: (!) 89% 100%  92%  Weight:   76.4 kg   Height:        Intake/Output Summary (Last 24 hours) at 03/10/2024 0752 Last data filed at 03/10/2024 0300 Gross per 24 hour  Intake --  Output 2290 ml  Net -2290 ml      03/10/2024    5:00 AM 03/10/2024   12:30 AM 03/09/2024    3:02 AM  Last 3 Weights  Weight (lbs) 168 lb 6.9 oz 168 lb 6.9 oz 170 lb 6.7 oz  Weight (kg) 76.4 kg 76.4 kg 77.3 kg      Telemetry/ECG  Sinus rhythm with rare PVCs.  1 episode of NSVT 03/09/2024 at approximately 1117 (asymptomatic)- Personally Reviewed  Physical Exam  GEN: No acute distress.  Hemodynamically stable, Neck: No JVD Cardiac: RRR, no murmurs, rubs, or gallops.  Respiratory: Decreased breath sounds bilaterally, no wheezes rales or rhonchi's. GI: Soft, nontender, non-distended  MS: No edema  Assessment & Plan  NSTEMI Precordial pain with mixed features, mild elevation in troponins, EKG illustrates inferolateral ST depressions, EKG notes reduced LVEF with regional wall motion normalities. Currently chest pain-free, had an episode during the night shift responsive to sublingual nitroglycerin  tablets 1 episode of NSVT, asymptomatic noted on telemetry, 11 beats Hemodynamically stable. Continue IV heparin  drip Start IV heparin  drip per ACS protocol. Continue aspirin  81 mg p.o. daily. Primary team has held statin therapy due to AST ALT levels. Discussed the  risks, benefits, and alternatives to left and right heart catheterization.  Patient agreeable.  Patient's nurse was present at bedside when the informed consent was being obtained.  Informed Consent   Shared Decision Making/Informed Consent The risks [stroke (1 in 1000), death (1 in 1000), kidney failure [usually temporary] (1 in 500), bleeding (1 in 200), allergic reaction [possibly serious] (1 in 200)], benefits (diagnostic support and management of coronary artery disease) and alternatives of a left and right cardiac catheterization were discussed in detail with Maxwell Rosales and he is willing to proceed.    NSVT Asymptomatic, noted on telemetry Currently on carvedilol . Replaced potassium and magnesium  Continue telemetry. Ischemic workup as discussed above  Acute on chronic systolic and diastolic heart failure. Newly discovered cardiomyopathy, likely ischemic LVEF 35-40% Continue carvedilol  12.5 mg p.o. twice daily. Continue IV Lasix  40 mg IV push daily. Continue Entresto  24/26 mg p.o. twice daily Net IO Since Admission: -1,775 mL [03/10/24 0752]  Hypertension: Improving after uptitration of GDMT. Monitor for now  PAD: - Moderate PAD by ABI (R ABI 0.79 and L ABI 0.75).  Imaging from August 2025 showed near occlusive stenosis of the aortic bifurcation and origins of the internal iliac arteries bilaterally.  He will need peripheral angiogram sooner.  - Continue aspirin  81 mg once daily. - Did start statin therapy, currently being held by primary team due to LFTs - Has an appointment with vascular  surgery on April 10, 2024.  Alcohol induced liver injury: LFTs have remained around 100-200s Has been drinking alcohol for 20 years. 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 Surveillance every 3 years, follow-up as outpatient with PCP/vascular surgery     For questions or updates, please contact [facility] Please consult [email or web address] for contact info under   Signed, Elliot Meldrum, DO

## 2024-03-10 NOTE — Progress Notes (Signed)
 PHARMACY - ANTICOAGULATION CONSULT NOTE  Pharmacy Consult for heparin  Indication: NSTEMI  Labs: Recent Labs    03/07/24 0430 03/07/24 0437 03/08/24 0903 03/08/24 1044 03/09/24 1819 03/10/24 0230  HGB 12.6*  --  14.2  --   --  15.1  HCT 37.4*  --  41.1  --   --  44.4  PLT 193  --  183  --   --  175  LABPROT  --   --  12.5  --   --   --   INR  --   --  0.9  --   --   --   HEPARINUNFRC  --   --   --   --  0.10* 0.15*  CREATININE 0.63  --  0.62  --   --   --   TROPONINIHS  --  18* 18* 18*  --   --    Assessment: 53yo male remains subtherapeutic on heparin  after rate change; no infusion issues or signs of bleeding per RN.  Goal of Therapy:  Heparin  level 0.3-0.7 units/ml   Plan:  2000 units heparin  bolus. Increase heparin  infusion by 3 units/kg/hr to 1400 units/hr. Check level in 6 hours.   Marvetta Dauphin, PharmD, BCPS 03/10/2024 4:02 AM

## 2024-03-10 NOTE — Progress Notes (Addendum)
 [facility]                                                                              Maxwell Rosales, is a 53 y.o. male, DOB - 1971-02-16, FMW:[id number] Admit date - 03/08/2024    Outpatient Primary MD for the patient is Glennon Sand, NP  LOS - 2  days  Chief Complaint  Patient presents with   Abdominal Pain       Brief summary   Patient is a 53 year old male with history of alcohol use, quit 3 weeks ago, tobacco use, CAD, HTN, HLP, PAD, recent hospitalization and discharged on 8/23 due to alcohol withdrawals, another admission on 9/11 for chest pain thought secondary to acute cholecystitis, workup showed a 2D echo with EF of 35% with regional wall motion abnormalities. Patient presented to [facility] Rosales, ED with shortness of breath and chest pressure that woke him up from sleep, reported dyspnea on exertion.  No fever or chills, no leg extremity edema or hemoptysis.   In ED, BP 170/90, labs were significant for worsening LFTs, AST 251, ALT 244, total bili 1.4, seen by general surgery, HIDA scan no concern of acute cholecystitis, troponins mildly elevated at 18, EKG showing ST depression in inferior and lateral leads, cardiology were consulted who recommended transfer to [facility] for cardiac cath on Monday.    Assessment & Plan    Chest pain with NSTEMI,  New ischemic cardiomyopathy, EF 35 to 40% - Presented with chest pain, mild elevation in troponin with EKG showing inferior lateral ST depressions. - Currently on IV heparin  drip, continue aspirin  81 mg daily - Continue IV Lasix , plan for cardiac cath on Monday 9/15, cardiology following - Management per cardiology, n.p.o. after midnight    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 failure) (HCC) New cardiomyopathy with a EF 35 to 40%, likely ischemic cardiomyopathy - Continue IV Lasix , strict I's and O's and daily weights - On Coreg , Entresto , BP soft, will defer to  cardiology - Plan for cardiac cath on Monday 9/15 per cardiology    Hypertension, poorly controlled - BP now soft, on Lasix , Coreg , Entresto , will defer to cardiology   Transaminitis Fatty liver Acute cholecystitis? - Recent admission with CT chest and ultrasound with some concern for acute cholecystitis -Seen by general surgery at [facility], recommended extensive cardiology workup should the patient require surgical intervention, recommended HIDA - HIDA scan normal, with filling of gallbladder indicating cystic duct patency - LFTs improving    Severe PAD  - Diagnosed with PAD, was supposed to follow-up with Dr. Silver on 9/11  - Appointment has been rescheduled with Dr. Gretta on 10/15.   - Discussed with Dr. Silver (on-call), will call vascular surgery and formally consult after the cardiac cath  Tobacco abuse -counseled to continue with nicotine  patch   Infrarenal AAA 3.4 cm -Will need surveillance every 3 years   Alcohol use - abstinence from alcohol since recently discharged on 8/23, so no concern for withdrawals - Continue thiamine  and folic acid    Estimated body mass index is 24.17 kg/m as calculated from the following:  Height as of this encounter: 5' 10 (1.778 m).   Weight as of this encounter: 76.4 kg.  Code Status: Full code DVT Prophylaxis:  SCDs Start: 03/08/24 1733   Level of Care: Level of care: Telemetry Cardiac Family Communication: Updated patient Disposition Plan:      Remains inpatient appropriate: Plan for cardiac cath on Monday   Procedures:  2D echo HIDA scan Consultants:   General Surgery Cardiology  Antimicrobials:   Anti-infectives (From admission, onward)    Start     Dose/Rate Route Frequency Ordered Stop   03/08/24 2200  amoxicillin -clavulanate (AUGMENTIN ) 875-125 MG per tablet 1 tablet        1 tablet Oral 2 times daily 03/08/24 1732            Medications  amoxicillin -clavulanate  1 tablet Oral BID   aspirin  EC   81 mg Oral Q breakfast   carvedilol   3.125 mg Oral BID   folic acid   1 mg Oral Daily   furosemide   40 mg Intravenous Daily   nicotine   21 mg Transdermal Daily   sacubitril -valsartan   1 tablet Oral BID   thiamine   100 mg Oral Daily      Subjective:   Maxwell Rosales was seen and examined today.  No acute complaints, no chest pain, BP soft.  No dizziness lightheadedness, shortness of breath.   Objective:   Vitals:   03/10/24 0236 03/10/24 0434 03/10/24 0500 03/10/24 0719  BP:  102/72  103/68  Pulse: 95 81  72  Resp:  17  18  Temp: 98.5 F (36.9 C) 98.4 F (36.9 C)  98.5 F (36.9 C)  TempSrc: Oral Oral  Oral  SpO2: (!) 89% 100%  92%  Weight:   76.4 kg   Height:        Intake/Output Summary (Last 24 hours) at 03/10/2024 1144 Last data filed at 03/10/2024 0920 Gross per 24 hour  Intake 240 ml  Output 2740 ml  Net -2500 ml     Wt Readings from Last 3 Encounters:  03/10/24 76.4 kg  03/06/24 83 kg  03/01/24 83 kg   Physical Exam General: Alert and oriented x 3, NAD Cardiovascular: S1 S2 clear, RRR.  Respiratory: CTAB, no wheezing Gastrointestinal: Soft, nontender, nondistended, NBS Ext: no pedal edema bilaterally Neuro: no new deficits Psych: Normal affect     Data Reviewed:  I have personally reviewed following labs    CBC Lab Results  Component Value Date   WBC 16.3 (H) 03/10/2024   RBC 4.28 03/10/2024   HGB 15.1 03/10/2024   HCT 44.4 03/10/2024   MCV 103.7 (H) 03/10/2024   MCH 35.3 (H) 03/10/2024   PLT 175 03/10/2024   MCHC 34.0 03/10/2024   RDW 13.6 03/10/2024   LYMPHSABS 1.3 03/08/2024   MONOABS 0.6 03/08/2024   EOSABS 0.1 03/08/2024   BASOSABS 0.1 03/08/2024     Last metabolic panel Lab Results  Component Value Date   NA 134 (L) 03/10/2024   K 3.9 03/10/2024   CL 98 03/10/2024   CO2 25 03/10/2024   BUN 8 03/10/2024   CREATININE 0.80 03/10/2024   GLUCOSE 100 (H) 03/10/2024   GFRNONAA >60 03/10/2024   CALCIUM  9.0 03/10/2024   PROT  5.8 (L) 03/10/2024   ALBUMIN 2.7 (L) 03/10/2024   BILITOT 1.0 03/10/2024   ALKPHOS 174 (H) 03/10/2024   AST 106 (H) 03/10/2024   ALT 161 (H) 03/10/2024   ANIONGAP 11 03/10/2024    CBG (last 3)  No results for input(s): GLUCAP in the last 72 hours.    Coagulation Profile: Recent Labs  Lab 03/08/24 0903  INR 0.9     Radiology Studies: I have personally reviewed the imaging studies  NM Hepatobiliary Liver Func Result Date: 03/08/2024 CLINICAL DATA:  Cholelithiasis.  Abdominal discomfort EXAM: NUCLEAR MEDICINE HEPATOBILIARY IMAGING TECHNIQUE: Sequential images of the abdomen were obtained out to 60 minutes following intravenous administration of radiopharmaceutical. RADIOPHARMACEUTICALS:  4.7 mCi Tc-60m  Choletec  IV COMPARISON:  Abdominal ultrasound 03/07/2024 FINDINGS: Prompt uptake of radiopharmaceutical from the blood pool. Biliary activity faintly visible at 10 minutes with bowel activity at 17 minutes. Gallbladder activity first becomes visible at 25 minutes. This indicates cystic duct patency and common bile duct patency. IMPRESSION: 1. Normal hepatobiliary scan, with filling of the gallbladder indicating cystic duct patency. Electronically Signed   By: Ryan Salvage M.D.   On: 03/08/2024 15:36       Giovan Pinsky M.D. [facility] 03/10/2024, 11:44 AM  Available via Epic secure chat 7am-7pm After 7 pm, please refer to night coverage provider listed on amion.

## 2024-03-10 NOTE — Progress Notes (Signed)
 PHARMACY - ANTICOAGULATION CONSULT NOTE  Pharmacy Consult for heparin  Indication: NSTEMI  Labs: Recent Labs    03/08/24 0903 03/08/24 1044 03/09/24 1819 03/10/24 0230 03/10/24 0918  HGB 14.2  --   --  15.1  --   HCT 41.1  --   --  44.4  --   PLT 183  --   --  175  --   LABPROT 12.5  --   --   --   --   INR 0.9  --   --   --   --   HEPARINUNFRC  --   --  0.10* 0.15* 0.23*  CREATININE 0.62  --   --  0.80  --   TROPONINIHS 18* 18*  --   --   --    Assessment: 53yo male remains subtherapeutic on heparin  after rate change; no infusion issues or signs of bleeding per RN.  Goal of Therapy:  Heparin  level 0.3-0.7 units/ml   Plan:  1000 units heparin  bolus Increase heparin  infusion by 2 units/kg/hr to 1550 units/hr. Check level in 6 hours.   R. Samual Satterfield, PharmD PGY-1 Acute Care Pharmacy Resident [facility] Please refer to [facility] for [facility] numbers 03/10/2024 10:15 AM

## 2024-03-10 NOTE — Progress Notes (Addendum)
 PHARMACY - ANTICOAGULATION CONSULT NOTE  Pharmacy Consult for Heparin  Indication: NSTEMI  No Known Allergies  Patient Measurements: Height: 5' 10 (177.8 cm) Weight: 76.4 kg (168 lb 6.9 oz) IBW/kg (Calculated) : 73 HEPARIN  DW (KG): 77.2  Vital Signs: Temp: 98.5 F (36.9 C) (09/14 1600) Temp Source: Oral (09/14 1600) BP: 115/84 (09/14 1600) Pulse Rate: 80 (09/14 1600)  Labs: Recent Labs    03/08/24 0903 03/08/24 1044 03/09/24 1819 03/10/24 0230 03/10/24 0918 03/10/24 1646  HGB 14.2  --   --  15.1  --   --   HCT 41.1  --   --  44.4  --   --   PLT 183  --   --  175  --   --   LABPROT 12.5  --   --   --   --   --   INR 0.9  --   --   --   --   --   HEPARINUNFRC  --   --    < > 0.15* 0.23* <0.10*  CREATININE 0.62  --   --  0.80  --   --   TROPONINIHS 18* 18*  --   --   --   --    < > = values in this interval not displayed.    Estimated Creatinine Clearance: 109 mL/min (by C-G formula based on SCr of 0.8 mg/dL).   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 8 tablet 0 Unknown   aspirin  EC 81 MG tablet Take 1 tablet (81 mg total) by mouth daily with breakfast. Swallow whole. 30 tablet 11 03/08/2024 at  5:00 AM   folic acid  (FOLVITE ) 1 MG tablet Take 1 tablet (1 mg total) by mouth daily. 90 tablet 3 03/08/2024 Morning   ibuprofen  (ADVIL ) 200 MG tablet Take 400-600 mg by mouth every 6 (six) hours as needed for mild pain (pain score 1-3).   03/06/2024   metoprolol  tartrate (LOPRESSOR ) 25 MG tablet Take 1 tablet (25 mg total) by mouth 2 (two) times daily. 180 tablet 3 03/08/2024 Morning   Misc Natural Products (NEURIVA PO) Take 1 capsule by mouth daily. For nerve pain   03/06/2024   nicotine   (NICODERM CQ  - DOSED IN MG/24 HOURS) 21 mg/24hr patch Place 1 patch (21 mg total) onto the skin daily. 28 patch 0 Unknown   traZODone  (DESYREL ) 150 MG tablet Take 1 tablet (150 mg total) by mouth at bedtime. 90 tablet 1 03/04/2024   Scheduled:   amoxicillin -clavulanate  1 tablet Oral BID   aspirin  EC  81 mg Oral Q breakfast   carvedilol   3.125 mg Oral BID   folic acid   1 mg Oral Daily   [START ON 03/11/2024] free water   250 mL Oral Once   furosemide   40 mg Intravenous Daily   nicotine   21 mg Transdermal Daily   sacubitril -valsartan   1 tablet Oral BID   thiamine   100 mg Oral Daily   Infusions:   heparin  1,550 Units/hr (03/10/24 1115)   PRN: albuterol , hydrALAZINE , morphine  injection, nitroGLYCERIN , oxyCODONE , zolpidem   Assessment: 53 YO M with PMH significant for alcohol abuse, tobacco abuse, CAD, hypertension, hyperlipidemia, PAD presents to [facility] for NSTEMI with  EKG illustrating inferolateral ST depressions and reduced LVEF with regional wall motion abnormalities.  Of note, patient has received a dose of 5,000 units of heparin  for DVT prophylaxis this AM before switching to heparin  drip; therefore will not bolus at this time and will start the heparin  infusion now.   Hgb 14.2, Hct 41.1, Plt 183  Heparin  level undetectable on 1550 units/hr.  Heparin  drip had been off for 30-45 min shortly before level collected due to issues with IV.  Resumed around 4 pm.   Goal of Therapy:  Heparin  level 0.3-0.7 units/ml Monitor platelets by anticoagulation protocol: Yes   Plan:  Continue IV Heparin  at current rate of 1550 units/hr Repeat heparin  level at 10 pm.  Harlene Barlow, Berdine BIRCH, BCPS, [facility] Clinical Pharmacist  03/10/2024 5:39 PM   [facility] pharmacy phone numbers are listed on [email or web address]  Addendum -  Repeat heparin  level remains low at 0.22 - will increase IV heparin  to 1650 units/hr.  Harlene Barlow, [employer]dine [employer]IRCH [employer], BCCP Clinical Pharmacist  03/10/2024 10:19 PM   [facility] pharmacy phone numbers  are listed on [email or web address]

## 2024-03-10 NOTE — Progress Notes (Signed)
 PT called out c/o 2/10 chest pain. Described it as dull pressure to upper mid chest. States this is how prior episodes of chest pain started then they progressively became worse. EKG obtained and on call provider for cardiology notified. VS 98/69 HR 95 O2 91 on RA.  Advised to proceed with giving SL as long at BP remains above 90 Systolic. nitroglycerin  x 1 given. Chest pain resolved.

## 2024-03-11 ENCOUNTER — Encounter (HOSPITAL_COMMUNITY)
Admission: EM | Disposition: A | Discharge status: Home / Self Care | Payer: Self-pay | Source: Home / Self Care | Attending: Internal Medicine

## 2024-03-11 ENCOUNTER — Encounter (HOSPITAL_COMMUNITY): Payer: Self-pay | Admitting: Internal Medicine

## 2024-03-11 DIAGNOSIS — R7989 Other specified abnormal findings of blood chemistry: Secondary | ICD-10-CM | POA: Diagnosis not present

## 2024-03-11 DIAGNOSIS — I25110 Atherosclerotic heart disease of native coronary artery with unstable angina pectoris: Secondary | ICD-10-CM

## 2024-03-11 DIAGNOSIS — F1010 Alcohol abuse, uncomplicated: Secondary | ICD-10-CM | POA: Diagnosis not present

## 2024-03-11 DIAGNOSIS — I214 Non-ST elevation (NSTEMI) myocardial infarction: Secondary | ICD-10-CM | POA: Diagnosis not present

## 2024-03-11 DIAGNOSIS — I5020 Unspecified systolic (congestive) heart failure: Secondary | ICD-10-CM

## 2024-03-11 DIAGNOSIS — I739 Peripheral vascular disease, unspecified: Secondary | ICD-10-CM | POA: Diagnosis not present

## 2024-03-11 DIAGNOSIS — R0789 Other chest pain: Secondary | ICD-10-CM | POA: Diagnosis not present

## 2024-03-11 DIAGNOSIS — R1010 Upper abdominal pain, unspecified: Secondary | ICD-10-CM | POA: Diagnosis not present

## 2024-03-11 DIAGNOSIS — I5023 Acute on chronic systolic (congestive) heart failure: Secondary | ICD-10-CM | POA: Diagnosis not present

## 2024-03-11 HISTORY — PX: CORONARY PRESSURE/FFR STUDY: CATH118243

## 2024-03-11 HISTORY — PX: RIGHT/LEFT HEART CATH AND CORONARY ANGIOGRAPHY: CATH118266

## 2024-03-11 HISTORY — PX: CORONARY STENT INTERVENTION: CATH118234

## 2024-03-11 LAB — POCT I-STAT 7, (LYTES, BLD GAS, ICA,H+H)
Acid-Base Excess: 1 mmol/L (ref 0.0–2.0)
Acid-Base Excess: 0 mmol/L (ref 0.0–2.0)
Bicarbonate: 27.5 mmol/L (ref 20.0–28.0)
Bicarbonate: 24.8 mmol/L (ref 20.0–28.0)
Calcium, Ion: 1.2 mmol/L (ref 1.15–1.40)
Calcium, Ion: 1.18 mmol/L (ref 1.15–1.40)
HCT: 47 % (ref 39.0–52.0)
HCT: 47 % (ref 39.0–52.0)
Hemoglobin: 16 g/dL (ref 13.0–17.0)
Hemoglobin: 16 g/dL (ref 13.0–17.0)
O2 Saturation: 61 %
O2 Saturation: 94 %
Potassium: 4.4 mmol/L (ref 3.5–5.1)
Potassium: 4.4 mmol/L (ref 3.5–5.1)
Sodium: 135 mmol/L (ref 135–145)
Sodium: 134 mmol/L — ABNORMAL LOW (ref 135–145)
TCO2: 29 mmol/L (ref 22–32)
TCO2: 26 mmol/L (ref 22–32)
pCO2 arterial: 47.3 mmHg (ref 32–48)
pCO2 arterial: 41.1 mmHg (ref 32–48)
pH, Arterial: 7.372 (ref 7.35–7.45)
pH, Arterial: 7.389 (ref 7.35–7.45)
pO2, Arterial: 33 mmHg — CL (ref 83–108)
pO2, Arterial: 72 mmHg — ABNORMAL LOW (ref 83–108)

## 2024-03-11 LAB — CBC
HCT: 44.4 % (ref 39.0–52.0)
Hemoglobin: 15.1 g/dL (ref 13.0–17.0)
MCH: 35.3 pg — ABNORMAL HIGH (ref 26.0–34.0)
MCHC: 34 g/dL (ref 30.0–36.0)
MCV: 103.7 fL — ABNORMAL HIGH (ref 80.0–100.0)
Platelets: 156 K/uL (ref 150–400)
RBC: 4.28 MIL/uL (ref 4.22–5.81)
RDW: 13.3 % (ref 11.5–15.5)
WBC: 6.8 K/uL (ref 4.0–10.5)
nRBC: 0 % (ref 0.0–0.2)

## 2024-03-11 LAB — COMPREHENSIVE METABOLIC PANEL WITH GFR
ALT: 107 U/L — ABNORMAL HIGH (ref 0–44)
AST: 39 U/L (ref 15–41)
Albumin: 2.8 g/dL — ABNORMAL LOW (ref 3.5–5.0)
Alkaline Phosphatase: 144 U/L — ABNORMAL HIGH (ref 38–126)
Anion gap: 12 (ref 5–15)
BUN: 13 mg/dL (ref 6–20)
CO2: 23 mmol/L (ref 22–32)
Calcium: 8.9 mg/dL (ref 8.9–10.3)
Chloride: 100 mmol/L (ref 98–111)
Creatinine, Ser: 0.84 mg/dL (ref 0.61–1.24)
GFR, Estimated: >60 mL/min (ref >60)
Glucose, Bld: 94 mg/dL (ref 70–99)
Potassium: 4.4 mmol/L (ref 3.5–5.1)
Sodium: 135 mmol/L (ref 135–145)
Total Bilirubin: 0.7 mg/dL (ref 0.0–1.2)
Total Protein: 5.9 g/dL — ABNORMAL LOW (ref 6.5–8.1)

## 2024-03-11 LAB — POCT ACTIVATED CLOTTING TIME
Activated Clotting Time: 274 s
Activated Clotting Time: 291 s
Activated Clotting Time: 285 s

## 2024-03-11 LAB — HEPARIN LEVEL (UNFRACTIONATED): Heparin Unfractionated: 0.38 [IU]/mL (ref 0.30–0.70)

## 2024-03-11 MED ORDER — VERAPAMIL HCL 2.5 MG/ML IV SOLN
INTRAVENOUS | Status: DC | PRN
  Administered 2024-03-11: 10 mL via INTRA_ARTERIAL

## 2024-03-11 MED ORDER — LIDOCAINE HCL (PF) 1 % IJ SOLN
INTRAMUSCULAR | Status: AC
  Filled 2024-03-11: qty 30

## 2024-03-11 MED ORDER — VERAPAMIL HCL 2.5 MG/ML IV SOLN
INTRAVENOUS | Status: AC
Start: 2024-03-11 — End: 2024-03-11
  Filled 2024-03-11: qty 2

## 2024-03-11 MED ORDER — SODIUM CHLORIDE 0.9 % IV SOLN
INTRAVENOUS | Status: DC | PRN
  Administered 2024-03-11: 250 mL via INTRAVENOUS

## 2024-03-11 MED ORDER — TICAGRELOR 90 MG PO TABS
90.0000 mg | ORAL_TABLET | Freq: Two times a day (BID) | ORAL | Status: DC
  Administered 2024-03-12: 90 mg via ORAL
  Filled 2024-03-11: qty 1

## 2024-03-11 MED ORDER — FENTANYL CITRATE (PF) 100 MCG/2ML IJ SOLN
INTRAMUSCULAR | Status: AC
  Filled 2024-03-11: qty 2

## 2024-03-11 MED ORDER — HEPARIN SODIUM (PORCINE) 1000 UNIT/ML IJ SOLN
INTRAMUSCULAR | Status: DC | PRN
  Administered 2024-03-11: 2000 [IU] via INTRAVENOUS
  Administered 2024-03-11: 3500 [IU] via INTRAVENOUS
  Administered 2024-03-11: 4000 [IU] via INTRAVENOUS
  Administered 2024-03-11: 2000 [IU] via INTRAVENOUS

## 2024-03-11 MED ORDER — HEPARIN SODIUM (PORCINE) 1000 UNIT/ML IJ SOLN
INTRAMUSCULAR | Status: AC
  Filled 2024-03-11: qty 10

## 2024-03-11 MED ORDER — LIDOCAINE HCL (PF) 1 % IJ SOLN
INTRAMUSCULAR | Status: DC | PRN
  Administered 2024-03-11 (×2): 2 mL via INTRADERMAL

## 2024-03-11 MED ORDER — SODIUM CHLORIDE 0.9% FLUSH
3.0000 mL | Freq: Two times a day (BID) | INTRAVENOUS | Status: DC
Start: 2024-03-11 — End: 2024-03-12
  Administered 2024-03-11 – 2024-03-12 (×2): 3 mL via INTRAVENOUS

## 2024-03-11 MED ORDER — IOHEXOL 350 MG/ML SOLN
INTRAVENOUS | Status: DC | PRN
  Administered 2024-03-11: 150 mL

## 2024-03-11 MED ORDER — MIDAZOLAM HCL 2 MG/2ML IJ SOLN
INTRAMUSCULAR | Status: DC | PRN
  Administered 2024-03-11: 1 mg via INTRAVENOUS

## 2024-03-11 MED ORDER — SODIUM CHLORIDE 0.9% FLUSH: 3.0000 mL | INTRAVENOUS | Status: DC | PRN

## 2024-03-11 MED ORDER — MIDAZOLAM HCL 2 MG/2ML IJ SOLN
INTRAMUSCULAR | Status: AC
  Filled 2024-03-11: qty 2

## 2024-03-11 MED ORDER — HEPARIN (PORCINE) IN NACL 1000-0.9 UT/500ML-% IV SOLN
INTRAVENOUS | Status: DC | PRN
  Administered 2024-03-11: 1000 mL

## 2024-03-11 MED ORDER — SODIUM CHLORIDE 0.9 % IV SOLN: INTRAVENOUS | Status: AC

## 2024-03-11 MED ORDER — HYDRALAZINE HCL 20 MG/ML IJ SOLN: 10.0000 mg | INTRAMUSCULAR | Status: AC | PRN

## 2024-03-11 MED ORDER — TICAGRELOR 90 MG PO TABS
ORAL_TABLET | ORAL | Status: DC | PRN
  Administered 2024-03-11: 180 mg via ORAL

## 2024-03-11 MED ORDER — FENTANYL CITRATE (PF) 100 MCG/2ML IJ SOLN
INTRAMUSCULAR | Status: DC | PRN
  Administered 2024-03-11: 25 ug via INTRAVENOUS

## 2024-03-11 MED ORDER — SODIUM CHLORIDE 0.9 % IV SOLN: 250.0000 mL | INTRAVENOUS | Status: DC | PRN

## 2024-03-11 MED ORDER — TICAGRELOR 90 MG PO TABS
ORAL_TABLET | ORAL | Status: AC
  Filled 2024-03-11: qty 2

## 2024-03-11 MED ORDER — ENOXAPARIN SODIUM 40 MG/0.4ML IJ SOSY
40.0000 mg | PREFILLED_SYRINGE | INTRAMUSCULAR | Status: DC
  Administered 2024-03-12: 40 mg via SUBCUTANEOUS
  Filled 2024-03-11: qty 0.4

## 2024-03-11 NOTE — H&P (View-Only) (Signed)
 Cardiology Progress Note  Patient ID: Maxwell Rosales. MRN: [id number] DOB: Aug 12, 1970 Date of Encounter: 03/11/2024 Primary Cardiologist: None  Subjective   Chief Complaint: SOB  HPI: NPO for LHC. No CP today.   ROS:  All other ROS reviewed and negative. Pertinent positives noted in the HPI.     Telemetry  Overnight telemetry shows SB 40-50 bpm, which I personally reviewed.   ECG  The most recent ECG shows SR 85 bpm, inferior infarct, anterolateral TWI, which I personally reviewed.   Physical Exam   Vitals:   03/11/24 0445 03/11/24 0447 03/11/24 0723 03/11/24 0911  BP: 105/67  98/77 115/73  Pulse: (!) 115  81 70  Resp: (!) 21  18   Temp: 98.3 F (36.8 C) 98.3 F (36.8 C) 98.1 F (36.7 C)   TempSrc: Oral Oral Oral   SpO2: 97%  98%   Weight: 76.1 kg     Height:        Intake/Output Summary (Last 24 hours) at 03/11/2024 0911 Last data filed at 03/11/2024 9390 Gross per 24 hour  Intake 1498.01 ml  Output 1250 ml  Net 248.01 ml       03/11/2024    4:45 AM 03/10/2024    5:00 AM 03/10/2024   12:30 AM  Last 3 Weights  Weight (lbs) 167 lb 12.3 oz 168 lb 6.9 oz 168 lb 6.9 oz  Weight (kg) 76.1 kg 76.4 kg 76.4 kg    Body mass index is 24.07 kg/m.  General: Well nourished, well developed, in no acute distress Head: Atraumatic, normal size  Eyes: PEERLA, EOMI  Neck: Supple, no JVD Endocrine: No thryomegaly Cardiac: Normal S1, S2; RRR; no murmurs, rubs, or gallops Lungs: Clear to auscultation bilaterally, no wheezing, rhonchi or rales  Abd: Soft, nontender, no hepatomegaly  Ext: No edema, pulses 2+ Musculoskeletal: No deformities, BUE and BLE strength normal and equal Skin: Warm and dry, no rashes   Neuro: Alert and oriented to person, place, time, and situation, CNII-XII grossly intact, no focal deficits  Psych: Normal mood and affect   Cardiac Studies  TTE 03/07/2024  1. Left ventricular ejection fraction, by estimation, is 35 to 40%. Left  ventricular  ejection fraction by 3D volume is 40 %. The left ventricle has  moderately decreased function. The left ventricle demonstrates regional  wall motion abnormalities (see  scoring diagram/findings for description). The left ventricular internal  cavity size was moderately to severely dilated. Left ventricular diastolic  parameters are consistent with Grade I diastolic dysfunction (impaired  relaxation). Elevated left  ventricular end-diastolic pressure. The average left ventricular global  longitudinal strain is -11.0 %. The global longitudinal strain is  abnormal.   2. Right ventricular systolic function is normal. The right ventricular  size is normal. Tricuspid regurgitation signal is inadequate for assessing  PA pressure.   3. The mitral valve is normal in structure. Trivial mitral valve  regurgitation. No evidence of mitral stenosis.   4. The aortic valve is tricuspid. Aortic valve regurgitation is not  visualized. No aortic stenosis is present.   5. The inferior vena cava is dilated in size with >50% respiratory  variability, suggesting right atrial pressure of 8 mmHg.   CTA Pelvis 02/13/2024 IMPRESSION: 1. 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 2. 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 3. Marked hepatic steatosis.  4. Radiopaque debris layering dependently within the gallbladder.  Patient Profile  Maxwell Rosales. is a 53 y.o. male with alcohol abuse, tobacco abuse, PAD, hypertension, hyperlipidemia admitted on 03/08/2024 with chest pain and shortness of breath.  Found to have new onset systolic heart failure and non-STEMI.  Assessment & Plan   # Non-STEMI # Acute systolic heart failure, EF 35 to 40% - Admitted with chest pain and shortness of breath.  Course complicated by what appears to be  alcoholic hepatitis.  Gallbladder workup is negative.  Ejection fraction reduced 35 to 40% with regional wall motion abnormality. - Transfer to [name] for left heart catheterization.  N.p.o. at midnight.  Will proceed with this today. - We will plan for diagnostic cath.  He has other medical issues that need to be sorted out before proceeding with PCI. - Hold further IV Lasix . - Continue carvedilol  3.125 mg twice daily, Entresto  24-26 mg twice daily.  We can add SGLT2 inhibitor after cath.  BP is a bit too soft for Aldactone at this time. - Continue aspirin .  Continue heparin  drip.  Informed Consent   Shared Decision Making/Informed Consent The risks [stroke (1 in 1000), death (1 in 1000), kidney failure [usually temporary] (1 in 500), bleeding (1 in 200), allergic reaction [possibly serious] (1 in 200)], benefits (diagnostic support and management of coronary artery disease) and alternatives of a cardiac catheterization were discussed in detail with Maxwell Rosales and he is willing to proceed.      # Transaminitis # Acute alcoholic hepatitis? # Acute cholecystitis? - HIDA scan negative.  Does not appear to be gallbladder related.  Could be related to heavy alcohol abuse prior to admission. - Proceeding with left heart cath today. - We will need guidance on when he can restart a statin.  # PAD # Moderate PAD by ABI # AAA 3.4 cm - Nearly occluded internal iliac arteries - Reports chronic leg pain.  No leg ulcerations.  He can see vascular surgery as an outpatient for my standpoint. - Recent ABIs showed moderate disease.  Suspect this can be managed as an outpatient.  # Etoh abuse # Tobacco abuse - cessation advised   # HTN -above meds      For questions or updates, please contact [facility] Please consult [email or web address] for contact info under   Signed, Maxwell T. Barbaraann, MD, [facility]  [facility]  03/11/2024 9:11 AM

## 2024-03-11 NOTE — Interval H&P Note (Signed)
 History and Physical Interval Note:  03/11/2024 4:02 PM  Maxwell Rosales.  has presented today for surgery, with the diagnosis of unstable angina and acute HFrEF.  The various methods of treatment have been discussed with the patient and family. After consideration of risks, benefits and other options for treatment, the patient has consented to  Procedure(s): RIGHT/LEFT HEART CATH AND CORONARY ANGIOGRAPHY (N/A) as a surgical intervention.  The patient's history has been reviewed, patient examined, no change in status, stable for surgery.  I have reviewed the patient's chart and labs.  Questions were answered to the patient's satisfaction.    Cath Lab Visit (complete for each Cath Lab visit)  Clinical Evaluation Leading to the Procedure:   ACS: Yes.    Non-ACS:  N/A  [name]

## 2024-03-11 NOTE — Plan of Care (Signed)

## 2024-03-11 NOTE — Plan of Care (Signed)

## 2024-03-11 NOTE — Progress Notes (Signed)
 Heart Failure Nurse Navigator Progress Note  PCP: Glennon Sand, NP PCP-Cardiologist: None Admission Diagnosis: Upper abdominal pain Elevated LFT's Admitted from: Home via POV  Presentation:   Maxwell Rosales. 53 y.o. male arrived at the ED [facility] on 03/06/24. He presented with central, non radiating severe chest pain, shortness of breath and a numb sensation in his legs while driving. It was not exertional.  No associated SOB, nausea /vomiting,abdominal pain or leg swelling. BP 152/98, Pulse 90.HS-Troponin 18, BNP-Not measured. Patient transferred to [facility] for Left heart catheterization.  CT Angiography Chest with Contrast:Abnormal appearance of the gallbladder consist with acute cholecystitis.  No evidence of Pulmonary embolus, Stable cardiomegaly and left ventricular dilatation, Aortic Atherosclerosis. Coronary artery atherosclerosis.  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   Shoulder dislocation      Social History   Socioeconomic History   Marital status: Married    Spouse name: Not on file   Number of children: Not on file   Years of education: Not on file   Highest education level: Not on file  Occupational History   Not on file  Tobacco Use   Smoking status: Every Day   Smokeless tobacco: Not on file  Vaping Use   Vaping status: Never Used  Substance and Sexual Activity   Alcohol use: Not Currently    Comment: patient reports no etoh since 01/2024 admission for alcoholic hepatitis/withdrawal   Drug use: No   Sexual activity: Yes  Other Topics Concern   Not on file  Social History Narrative   Not on file   Social Drivers of Health   Financial Resource Strain: Not on file  Food Insecurity: No Food Insecurity (03/08/2024)   Hunger Vital Sign    Worried About Running Out of Food in the Last Year: Never true    Ran Out of Food in the Last  Year: Never true  Transportation Needs: No Transportation Needs (03/08/2024)   PRAPARE - Administrator, Civil Service (Medical): No    Lack of Transportation (Non-Medical): No  Physical Activity: Not on file  Stress: Not on file  Social Connections: Moderately Integrated (03/06/2024)   Social Connection and Isolation Panel    Frequency of Communication with Friends and Family: More than three times a week    Frequency of Social Gatherings with Friends and Family: Three times a week    Attends Religious Services: Never    Active Member of Clubs or Organizations: No    Attends Banker Meetings: 1 to 4 times per year    Marital Status: Married  Water engineer and Provision:  Detailed education and instructions provided on heart failure disease management including the following:  Wife Maxwell Rosales at the bedside during HF education and interview.  Signs and symptoms of Heart Failure When to call the physician Importance of daily weights Low sodium diet Fluid restriction Medication management Anticipated future follow-up appointments  Patient education given on each of the above topics.  Patient acknowledges understanding via teach back method and acceptance of all instructions.  Education Materials:  Living Better With Heart Failure Booklet, HF zone tool, & Daily Weight Tracker Tool.  Patient has scale at home: Yes Patient has pill box at home: Yes    High Risk Criteria for Readmission and/or Poor Patient Outcomes: Heart failure hospital admissions (last 6 months): 0  No Show rate: 0% Difficult social situation: None Demonstrates medication adherence: Yes Primary Language: English Literacy level: Reading, Writing & Comprehension  Barriers of Care:   Alocohol & Tobacco Abuse Daily Weights New HF Medications Diet & Fluid Restrictions  Considerations/Referrals:  Referral made to Heart Failure Pharmacist Stewardship: N/A Referral made to  Heart Failure CSW/NCM TOC: No Referral made to Heart & Vascular TOC clinic: Yes.  [facility] TOC AHF Clinic 03/19/24 @ 3:00 PM  Items for Follow-up on DC/TOC: Tobacco & ETOH Cessation Daily Weights Diet & Fluid Restrictions Continued Heart Failure Education  Charmaine Pines, RN, BSN [facility] Heart Failure Navigator Secure Chat Only

## 2024-03-11 NOTE — Progress Notes (Addendum)
 [facility]                                                                              Maxwell Rosales, is a 53 y.o. male, DOB - Nov 21, 1970, FMW:[id number] Admit date - 03/08/2024    Outpatient Primary MD for the patient is Glennon Sand, NP  LOS - 3  days  Chief Complaint  Patient presents with   Abdominal Pain       Brief summary   Patient is a 53 year old male with history of alcohol use, quit 3 weeks ago, tobacco use, CAD, HTN, HLP, PAD, recent hospitalization and discharged on 8/23 due to alcohol withdrawals, another admission on 9/11 for chest pain thought secondary to acute cholecystitis, workup showed a 2D echo with EF of 35% with regional wall motion abnormalities. Patient presented to [name] ED with shortness of breath and chest pressure that woke him up from sleep, reported dyspnea on exertion.  No fever or chills, no leg extremity edema or hemoptysis.   In ED, BP 170/90, labs were significant for worsening LFTs, AST 251, ALT 244, total bili 1.4, seen by general surgery, HIDA scan no concern of acute cholecystitis, troponins mildly elevated at 18, EKG showing ST depression in inferior and lateral leads, cardiology were consulted who recommended transfer to [facility] for cardiac cath on Monday.    Assessment & Plan    Chest pain with NSTEMI,  New ischemic cardiomyopathy, EF 35 to 40% - Presented with chest pain, mild elevation in troponin with EKG showing inferior lateral ST depressions. - On aspirin , IV heparin  drip, plan for cardiac cath today     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 failure) (HCC) New cardiomyopathy with a EF 35 to 40%, likely ischemic cardiomyopathy - Continue IV Lasix , strict I's and O's and daily weights -Negative balance of 2.1 L - On Coreg , Entresto  - Plan for cardiac cath today    Hypertension, poorly controlled - BP stable  Transaminitis Fatty liver Acute  cholecystitis? - Recent admission with CT chest and ultrasound with some concern for acute cholecystitis -Seen by general surgery at [facility], recommended extensive cardiology workup should the patient require surgical intervention, recommended HIDA - HIDA scan normal, with filling of gallbladder indicating cystic duct patency - LFTs improving, continue to monitor    Severe PAD  - Diagnosed with PAD, was supposed to follow-up with Dr. Silver on 9/11  - Appointment has been rescheduled with Dr. Gretta on 10/15.   - Discussed with Dr. Silver (on-call) on 9/14, recommended to call vascular surgery for a formal consult after the cardiac cath is completed  Tobacco abuse -counseled to continue with nicotine  patch   Infrarenal AAA 3.4 cm -Will need surveillance every 3 years   Alcohol use - abstinence from alcohol since recently discharged on 8/23, so no concern for withdrawals - Continue thiamine  and folic acid    Estimated body mass index is 24.07 kg/m as calculated from the following:   Height as of this encounter: 5' 10 (1.778 m).   Weight as of this encounter: 76.1 kg.  Code Status: Full code DVT  Prophylaxis:  SCDs Start: 03/08/24 1733   Level of Care: Level of care: Telemetry Cardiac Family Communication: Updated patient's wife on phone  Disposition Plan:      Remains inpatient appropriate: Plan for cardiac cath on Monday   Procedures:  2D echo HIDA scan  Consultants:   General Surgery Cardiology  Antimicrobials:   Anti-infectives (From admission, onward)    Start     Dose/Rate Route Frequency Ordered Stop   03/08/24 2200  amoxicillin -clavulanate (AUGMENTIN ) 875-125 MG per tablet 1 tablet        1 tablet Oral 2 times daily 03/08/24 1732            Medications  amoxicillin -clavulanate  1 tablet Oral BID   aspirin  EC  81 mg Oral Q breakfast   carvedilol   3.125 mg Oral BID   folic acid   1 mg Oral Daily   nicotine   21 mg Transdermal Daily    sacubitril -valsartan   1 tablet Oral BID   thiamine   100 mg Oral Daily      Subjective:   Maxwell Rosales was seen and examined today.  No acute complaints, did not sleep well last night.  BP soft but stable.  No acute chest pain, shortness of breath, abdominal pain.   Objective:   Vitals:   03/11/24 0447 03/11/24 0723 03/11/24 0911 03/11/24 1107  BP:  98/77 115/73   Pulse:  81 70   Resp:  18    Temp: 98.3 F (36.8 C) 98.1 F (36.7 C)  98 F (36.7 C)  TempSrc: Oral Oral  Oral  SpO2:  98%    Weight:      Height:        Intake/Output Summary (Last 24 hours) at 03/11/2024 1131 Last data filed at 03/11/2024 1104 Gross per 24 hour  Intake 1258.01 ml  Output 1600 ml  Net -341.99 ml     Wt Readings from Last 3 Encounters:  03/11/24 76.1 kg  03/06/24 83 kg  03/01/24 83 kg   Physical Exam General: Alert and oriented x 3, NAD Cardiovascular: S1 S2 clear, RRR.  Respiratory: CTAB, no wheezing Gastrointestinal: Soft, nontender, nondistended, NBS Ext: no pedal edema bilaterally Neuro: no new deficits Psych: Normal affect     Data Reviewed:  I have personally reviewed following labs    CBC Lab Results  Component Value Date   WBC 6.8 03/11/2024   RBC 4.28 03/11/2024   HGB 15.1 03/11/2024   HCT 44.4 03/11/2024   MCV 103.7 (H) 03/11/2024   MCH 35.3 (H) 03/11/2024   PLT 156 03/11/2024   MCHC 34.0 03/11/2024   RDW 13.3 03/11/2024   LYMPHSABS 1.3 03/08/2024   MONOABS 0.6 03/08/2024   EOSABS 0.1 03/08/2024   BASOSABS 0.1 03/08/2024     Last metabolic panel Lab Results  Component Value Date   NA 135 03/11/2024   K 4.4 03/11/2024   CL 100 03/11/2024   CO2 23 03/11/2024   BUN 13 03/11/2024   CREATININE 0.84 03/11/2024   GLUCOSE 94 03/11/2024   GFRNONAA >60 03/11/2024   CALCIUM  8.9 03/11/2024   PROT 5.9 (L) 03/11/2024   ALBUMIN 2.8 (L) 03/11/2024   BILITOT 0.7 03/11/2024   ALKPHOS 144 (H) 03/11/2024   AST 39 03/11/2024   ALT 107 (H) 03/11/2024   ANIONGAP  12 03/11/2024    CBG (last 3)  No results for input(s): GLUCAP in the last 72 hours.    Coagulation Profile: Recent Labs  Lab 03/08/24 0903  INR 0.9  Radiology Studies: I have personally reviewed the imaging studies  No results found.      Nydia Distance M.D. [facility] 03/11/2024, 11:31 AM  Available via Epic secure chat 7am-7pm After 7 pm, please refer to night coverage provider listed on amion.

## 2024-03-11 NOTE — Progress Notes (Signed)
 TR Band off at 2230, no new issues noted, will continue to monitor.

## 2024-03-11 NOTE — Progress Notes (Signed)
 Cardiology Progress Note  Patient ID: Maxwell Rosales. MRN: [id number] DOB: Aug 12, 1970 Date of Encounter: 03/11/2024 Primary Cardiologist: None  Subjective   Chief Complaint: SOB  HPI: NPO for LHC. No CP today.   ROS:  All other ROS reviewed and negative. Pertinent positives noted in the HPI.     Telemetry  Overnight telemetry shows SB 40-50 bpm, which I personally reviewed.   ECG  The most recent ECG shows SR 85 bpm, inferior infarct, anterolateral TWI, which I personally reviewed.   Physical Exam   Vitals:   03/11/24 0445 03/11/24 0447 03/11/24 0723 03/11/24 0911  BP: 105/67  98/77 115/73  Pulse: (!) 115  81 70  Resp: (!) 21  18   Temp: 98.3 F (36.8 C) 98.3 F (36.8 C) 98.1 F (36.7 C)   TempSrc: Oral Oral Oral   SpO2: 97%  98%   Weight: 76.1 kg     Height:        Intake/Output Summary (Last 24 hours) at 03/11/2024 0911 Last data filed at 03/11/2024 9390 Gross per 24 hour  Intake 1498.01 ml  Output 1250 ml  Net 248.01 ml       03/11/2024    4:45 AM 03/10/2024    5:00 AM 03/10/2024   12:30 AM  Last 3 Weights  Weight (lbs) 167 lb 12.3 oz 168 lb 6.9 oz 168 lb 6.9 oz  Weight (kg) 76.1 kg 76.4 kg 76.4 kg    Body mass index is 24.07 kg/m.  General: Well nourished, well developed, in no acute distress Head: Atraumatic, normal size  Eyes: PEERLA, EOMI  Neck: Supple, no JVD Endocrine: No thryomegaly Cardiac: Normal S1, S2; RRR; no murmurs, rubs, or gallops Lungs: Clear to auscultation bilaterally, no wheezing, rhonchi or rales  Abd: Soft, nontender, no hepatomegaly  Ext: No edema, pulses 2+ Musculoskeletal: No deformities, BUE and BLE strength normal and equal Skin: Warm and dry, no rashes   Neuro: Alert and oriented to person, place, time, and situation, CNII-XII grossly intact, no focal deficits  Psych: Normal mood and affect   Cardiac Studies  TTE 03/07/2024  1. Left ventricular ejection fraction, by estimation, is 35 to 40%. Left  ventricular  ejection fraction by 3D volume is 40 %. The left ventricle has  moderately decreased function. The left ventricle demonstrates regional  wall motion abnormalities (see  scoring diagram/findings for description). The left ventricular internal  cavity size was moderately to severely dilated. Left ventricular diastolic  parameters are consistent with Grade I diastolic dysfunction (impaired  relaxation). Elevated left  ventricular end-diastolic pressure. The average left ventricular global  longitudinal strain is -11.0 %. The global longitudinal strain is  abnormal.   2. Right ventricular systolic function is normal. The right ventricular  size is normal. Tricuspid regurgitation signal is inadequate for assessing  PA pressure.   3. The mitral valve is normal in structure. Trivial mitral valve  regurgitation. No evidence of mitral stenosis.   4. The aortic valve is tricuspid. Aortic valve regurgitation is not  visualized. No aortic stenosis is present.   5. The inferior vena cava is dilated in size with >50% respiratory  variability, suggesting right atrial pressure of 8 mmHg.   CTA Pelvis 02/13/2024 IMPRESSION: 1. 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 2. 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 3. Marked hepatic steatosis.  4. Radiopaque debris layering dependently within the gallbladder.  Patient Profile  Maxwell Rosales. is a 53 y.o. male with alcohol abuse, tobacco abuse, PAD, hypertension, hyperlipidemia admitted on 03/08/2024 with chest pain and shortness of breath.  Found to have new onset systolic heart failure and non-STEMI.  Assessment & Plan   # Non-STEMI # Acute systolic heart failure, EF 35 to 40% - Admitted with chest pain and shortness of breath.  Course complicated by what appears to be  alcoholic hepatitis.  Gallbladder workup is negative.  Ejection fraction reduced 35 to 40% with regional wall motion abnormality. - Transfer to [name] for left heart catheterization.  N.p.o. at midnight.  Will proceed with this today. - We will plan for diagnostic cath.  He has other medical issues that need to be sorted out before proceeding with PCI. - Hold further IV Lasix . - Continue carvedilol  3.125 mg twice daily, Entresto  24-26 mg twice daily.  We can add SGLT2 inhibitor after cath.  BP is a bit too soft for Aldactone at this time. - Continue aspirin .  Continue heparin  drip.  Informed Consent   Shared Decision Making/Informed Consent The risks [stroke (1 in 1000), death (1 in 1000), kidney failure [usually temporary] (1 in 500), bleeding (1 in 200), allergic reaction [possibly serious] (1 in 200)], benefits (diagnostic support and management of coronary artery disease) and alternatives of a cardiac catheterization were discussed in detail with Maxwell Rosales and he is willing to proceed.      # Transaminitis # Acute alcoholic hepatitis? # Acute cholecystitis? - HIDA scan negative.  Does not appear to be gallbladder related.  Could be related to heavy alcohol abuse prior to admission. - Proceeding with left heart cath today. - We will need guidance on when he can restart a statin.  # PAD # Moderate PAD by ABI # AAA 3.4 cm - Nearly occluded internal iliac arteries - Reports chronic leg pain.  No leg ulcerations.  He can see vascular surgery as an outpatient for my standpoint. - Recent ABIs showed moderate disease.  Suspect this can be managed as an outpatient.  # Etoh abuse # Tobacco abuse - cessation advised   # HTN -above meds      For questions or updates, please contact [facility] Please consult [email or web address] for contact info under   Signed, Darryle T. Barbaraann, MD, [facility]  [facility]  03/11/2024 9:11 AM

## 2024-03-12 ENCOUNTER — Other Ambulatory Visit (HOSPITAL_COMMUNITY): Payer: Self-pay

## 2024-03-12 ENCOUNTER — Encounter (HOSPITAL_COMMUNITY)

## 2024-03-12 ENCOUNTER — Inpatient Hospital Stay (HOSPITAL_COMMUNITY)

## 2024-03-12 ENCOUNTER — Telehealth (HOSPITAL_COMMUNITY): Payer: Self-pay | Admitting: Pharmacy Technician

## 2024-03-12 DIAGNOSIS — I255 Ischemic cardiomyopathy: Secondary | ICD-10-CM | POA: Diagnosis not present

## 2024-03-12 DIAGNOSIS — I2510 Atherosclerotic heart disease of native coronary artery without angina pectoris: Secondary | ICD-10-CM | POA: Diagnosis not present

## 2024-03-12 DIAGNOSIS — I7090 Unspecified atherosclerosis: Secondary | ICD-10-CM | POA: Diagnosis not present

## 2024-03-12 DIAGNOSIS — I5023 Acute on chronic systolic (congestive) heart failure: Secondary | ICD-10-CM | POA: Diagnosis not present

## 2024-03-12 DIAGNOSIS — R0789 Other chest pain: Secondary | ICD-10-CM | POA: Diagnosis not present

## 2024-03-12 DIAGNOSIS — Z955 Presence of coronary angioplasty implant and graft: Secondary | ICD-10-CM | POA: Diagnosis not present

## 2024-03-12 DIAGNOSIS — R1010 Upper abdominal pain, unspecified: Secondary | ICD-10-CM | POA: Diagnosis not present

## 2024-03-12 DIAGNOSIS — I214 Non-ST elevation (NSTEMI) myocardial infarction: Secondary | ICD-10-CM

## 2024-03-12 DIAGNOSIS — I739 Peripheral vascular disease, unspecified: Secondary | ICD-10-CM | POA: Diagnosis not present

## 2024-03-12 LAB — COMPREHENSIVE METABOLIC PANEL WITH GFR
ALT: 82 U/L — ABNORMAL HIGH (ref 0–44)
AST: 32 U/L (ref 15–41)
Albumin: 2.8 g/dL — ABNORMAL LOW (ref 3.5–5.0)
Alkaline Phosphatase: 128 U/L — ABNORMAL HIGH (ref 38–126)
Anion gap: 11 (ref 5–15)
BUN: 16 mg/dL (ref 6–20)
CO2: 22 mmol/L (ref 22–32)
Calcium: 9 mg/dL (ref 8.9–10.3)
Chloride: 101 mmol/L (ref 98–111)
Creatinine, Ser: 0.92 mg/dL (ref 0.61–1.24)
GFR, Estimated: >60 mL/min (ref >60)
Glucose, Bld: 100 mg/dL — ABNORMAL HIGH (ref 70–99)
Potassium: 4.4 mmol/L (ref 3.5–5.1)
Sodium: 134 mmol/L — ABNORMAL LOW (ref 135–145)
Total Bilirubin: 0.7 mg/dL (ref 0.0–1.2)
Total Protein: 6 g/dL — ABNORMAL LOW (ref 6.5–8.1)

## 2024-03-12 LAB — CBC
HCT: 44.1 % (ref 39.0–52.0)
Hemoglobin: 15.2 g/dL (ref 13.0–17.0)
MCH: 35.7 pg — ABNORMAL HIGH (ref 26.0–34.0)
MCHC: 34.5 g/dL (ref 30.0–36.0)
MCV: 103.5 fL — ABNORMAL HIGH (ref 80.0–100.0)
Platelets: 153 K/uL (ref 150–400)
RBC: 4.26 MIL/uL (ref 4.22–5.81)
RDW: 13.6 % (ref 11.5–15.5)
WBC: 7.5 K/uL (ref 4.0–10.5)
nRBC: 0 % (ref 0.0–0.2)

## 2024-03-12 LAB — VAS US ABI WITH/WO TBI
Left ABI: 0.67
Right ABI: 0.64

## 2024-03-12 MED ORDER — TICAGRELOR 90 MG PO TABS
90.0000 mg | ORAL_TABLET | Freq: Two times a day (BID) | ORAL | 3 refills | Status: DC
  Filled 2024-03-12: qty 60, 30d supply, fill #0

## 2024-03-12 MED ORDER — ZOLPIDEM TARTRATE 10 MG PO TABS: 10.0000 mg | ORAL_TABLET | Freq: Every evening | ORAL | 0 refills | Status: DC | PRN

## 2024-03-12 MED ORDER — GABAPENTIN 100 MG PO CAPS
100.0000 mg | ORAL_CAPSULE | Freq: Three times a day (TID) | ORAL | Status: DC
Start: 2024-03-12 — End: 2024-03-12

## 2024-03-12 MED ORDER — CARVEDILOL 3.125 MG PO TABS
3.1250 mg | ORAL_TABLET | Freq: Two times a day (BID) | ORAL | 3 refills | Status: DC
  Filled 2024-03-12: qty 60, 30d supply, fill #0

## 2024-03-12 MED ORDER — CARVEDILOL 3.125 MG PO TABS: 3.1250 mg | ORAL_TABLET | Freq: Two times a day (BID) | ORAL | 3 refills | Status: DC

## 2024-03-12 MED ORDER — OXYCODONE HCL 5 MG PO TABS
5.0000 mg | ORAL_TABLET | Freq: Four times a day (QID) | ORAL | 0 refills | Status: DC | PRN
  Filled 2024-03-12: qty 30, 8d supply, fill #0

## 2024-03-12 MED ORDER — EMPAGLIFLOZIN 10 MG PO TABS
10.0000 mg | ORAL_TABLET | Freq: Every day | ORAL | 3 refills | Status: DC
  Filled 2024-03-12: qty 30, 30d supply, fill #0

## 2024-03-12 MED ORDER — TICAGRELOR 90 MG PO TABS: 90.0000 mg | ORAL_TABLET | Freq: Two times a day (BID) | ORAL | 3 refills | Status: DC

## 2024-03-12 MED ORDER — NITROGLYCERIN 0.4 MG SL SUBL
0.4000 mg | SUBLINGUAL_TABLET | SUBLINGUAL | 12 refills | Status: DC | PRN
  Filled 2024-03-12: qty 25, 7d supply, fill #0

## 2024-03-12 MED ORDER — FUROSEMIDE 20 MG PO TABS
20.0000 mg | ORAL_TABLET | Freq: Every day | ORAL | 11 refills | Status: DC | PRN
Start: 2024-03-12 — End: 2024-03-12
  Filled 2024-03-12: qty 30, 30d supply, fill #0

## 2024-03-12 MED ORDER — FUROSEMIDE 20 MG PO TABS
20.0000 mg | ORAL_TABLET | Freq: Every day | ORAL | 11 refills | Status: AC | PRN
Start: 2024-03-12 — End: 2025-03-12

## 2024-03-12 MED ORDER — NITROGLYCERIN 0.4 MG SL SUBL: 0.4000 mg | SUBLINGUAL_TABLET | SUBLINGUAL | 12 refills | Status: AC | PRN

## 2024-03-12 MED ORDER — GABAPENTIN 100 MG PO CAPS
100.0000 mg | ORAL_CAPSULE | Freq: Three times a day (TID) | ORAL | 1 refills | Status: DC
  Filled 2024-03-12: qty 90, 30d supply, fill #0

## 2024-03-12 MED ORDER — FUROSEMIDE 40 MG PO TABS: 40.0000 mg | ORAL_TABLET | Freq: Every day | ORAL | 11 refills | Status: DC

## 2024-03-12 MED ORDER — SACUBITRIL-VALSARTAN 24-26 MG PO TABS
1.0000 | ORAL_TABLET | Freq: Two times a day (BID) | ORAL | 3 refills | Status: DC
  Filled 2024-03-12: qty 60, 30d supply, fill #0

## 2024-03-12 MED ORDER — GABAPENTIN 100 MG PO CAPS: 100.0000 mg | ORAL_CAPSULE | Freq: Three times a day (TID) | ORAL | 1 refills | Status: DC

## 2024-03-12 MED ORDER — EMPAGLIFLOZIN 10 MG PO TABS
10.0000 mg | ORAL_TABLET | Freq: Every day | ORAL | Status: DC
  Administered 2024-03-12: 10 mg via ORAL
  Filled 2024-03-12: qty 1

## 2024-03-12 MED ORDER — AMOXICILLIN-POT CLAVULANATE 875-125 MG PO TABS: 1.0000 | ORAL_TABLET | Freq: Two times a day (BID) | ORAL | 0 refills | Status: AC

## 2024-03-12 MED ORDER — SACUBITRIL-VALSARTAN 24-26 MG PO TABS: 1.0000 | ORAL_TABLET | Freq: Two times a day (BID) | ORAL | 3 refills | Status: DC

## 2024-03-12 MED ORDER — OXYCODONE HCL 5 MG PO TABS: 5.0000 mg | ORAL_TABLET | Freq: Four times a day (QID) | ORAL | 0 refills | Status: DC | PRN

## 2024-03-12 MED ORDER — EMPAGLIFLOZIN 10 MG PO TABS: 10.0000 mg | ORAL_TABLET | Freq: Every day | ORAL | 3 refills | Status: DC

## 2024-03-12 NOTE — Progress Notes (Signed)
 [facility]                                                                              Maxwell Rosales, is a 53 y.o. male, DOB - 1970-08-29, FMW:[id number] Admit date - 03/08/2024    Outpatient Primary MD for the patient is Glennon Sand, NP  LOS - 4  days  Chief Complaint  Patient presents with   Abdominal Pain       Brief summary   Patient is a 53 year old male with history of alcohol use, quit 3 weeks ago, tobacco use, CAD, HTN, HLP, PAD, recent hospitalization and discharged on 8/23 due to alcohol withdrawals, another admission on 9/11 for chest pain thought secondary to acute cholecystitis, workup showed a 2D echo with EF of 35% with regional wall motion abnormalities. Patient presented to [name] ED with shortness of breath and chest pressure that woke him up from sleep, reported dyspnea on exertion.  No fever or chills, no leg extremity edema or hemoptysis.   In ED, BP 170/90, labs were significant for worsening LFTs, AST 251, ALT 244, total bili 1.4, seen by general surgery, HIDA scan no concern of acute cholecystitis, troponins mildly elevated at 18, EKG showing ST depression in inferior and lateral leads, cardiology were consulted who recommended transfer to [facility] for cardiac cath on Monday.    Assessment & Plan    Chest pain with NSTEMI,  New ischemic cardiomyopathy, EF 35 to 40% - Presented with chest pain, mild elevation in troponin with EKG showing inferior lateral ST depressions. - Underwent cardiac cath on 9/15 status post PCI to focal 80% mid LAD and CTO RCA and circumflex - Cardiology following, plan aspirin , Brilinta  for 1 year    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 failure) (HCC) New cardiomyopathy with a EF 35 to 40%, likely ischemic cardiomyopathy - Continue IV Lasix , strict I's and O's and daily weights -Negative balance of 2.8 L - Per cardiology, continue Coreg , Entresto , Jardiance ,  would dc on Lasix  20 mg as needed    Severe PAD  - Diagnosed with PAD, was supposed to follow-up with Dr. Silver on 9/11  - Appointment has been rescheduled with Dr. Gretta on 10/15.   - Patient and family requested inpatient vascular surgery consult for further workup as patient has missed appointments due to hospitalization - Discussed with Dr Sheree, will follow recommendations   Hypertension, poorly controlled - BP stable  Transaminitis Fatty liver Acute cholecystitis? - Recent admission with CT chest and ultrasound with some concern for acute cholecystitis -Seen by general surgery at [facility], recommended extensive cardiology workup should the patient require surgical intervention, recommended HIDA - HIDA scan normal, with filling of gallbladder indicating cystic duct patency - LFTs improving, continue to monitor  Tobacco abuse -counseled to continue with nicotine  patch   Infrarenal AAA 3.4 cm -Will need surveillance every 3 years   Alcohol use - abstinence from alcohol since recently discharged on 8/23, so no concern for withdrawals - Continue thiamine  and folic acid    Estimated body mass index is 24.18 kg/m as calculated from the following:  Height as of this encounter: 5' 10 (1.778 m).   Weight as of this encounter: 76.4 kg.  Code Status: Full code DVT Prophylaxis:  enoxaparin  (LOVENOX ) injection 40 mg Start: 03/12/24 0800 SCDs Start: 03/08/24 1733   Level of Care: Level of care: Progressive Cardiac Family Communication: Updated patient's wife on 9/15 Disposition Plan:      Remains inpatient appropriate:   Procedures:  2D echo HIDA scan  Consultants:   General Surgery Cardiology  Antimicrobials:   Anti-infectives (From admission, onward)    Start     Dose/Rate Route Frequency Ordered Stop   03/08/24 2200  amoxicillin -clavulanate (AUGMENTIN ) 875-125 MG per tablet 1 tablet        1 tablet Oral 2 times daily 03/08/24 1732             Medications  amoxicillin -clavulanate  1 tablet Oral BID   aspirin  EC  81 mg Oral Q breakfast   carvedilol   3.125 mg Oral BID   empagliflozin   10 mg Oral Daily   enoxaparin  (LOVENOX ) injection  40 mg Subcutaneous Q24H   folic acid   1 mg Oral Daily   nicotine   21 mg Transdermal Daily   sacubitril -valsartan   1 tablet Oral BID   sodium chloride  flush  3 mL Intravenous Q12H   thiamine   100 mg Oral Daily   ticagrelor   90 mg Oral BID      Subjective:   Maxwell Rosales was seen and examined today.  Frustrated over not being able to sleep well, no acute chest pain, shortness of breath, fevers or chills, wants to see vascular surgery for the legs.  States has a resting leg pain in both LE.  Objective:   Vitals:   03/12/24 0610 03/12/24 0628 03/12/24 0827 03/12/24 1302  BP: 96/68  (!) 106/54 98/63  Pulse: 85  78   Resp: 18  17 19   Temp: 98.2 F (36.8 C)  97.7 F (36.5 C)   TempSrc: Oral  Oral   SpO2: 98%  97% 97%  Weight:  76.4 kg    Height:        Intake/Output Summary (Last 24 hours) at 03/12/2024 1438 Last data filed at 03/12/2024 1300 Gross per 24 hour  Intake 394.15 ml  Output 1100 ml  Net -705.85 ml     Wt Readings from Last 3 Encounters:  03/12/24 76.4 kg  03/06/24 83 kg  03/01/24 83 kg   Physical Exam General: Alert and oriented x 3, NAD Cardiovascular: S1 S2 clear, RRR.  Respiratory: CTAB, no wheezing Gastrointestinal: Soft, nontender, nondistended, NBS Ext: no pedal edema bilaterally Neuro: no new deficits Psych: Normal affect    Data Reviewed:  I have personally reviewed following labs    CBC Lab Results  Component Value Date   WBC 7.5 03/12/2024   RBC 4.26 03/12/2024   HGB 15.2 03/12/2024   HCT 44.1 03/12/2024   MCV 103.5 (H) 03/12/2024   MCH 35.7 (H) 03/12/2024   PLT 153 03/12/2024   MCHC 34.5 03/12/2024   RDW 13.6 03/12/2024   LYMPHSABS 1.3 03/08/2024   MONOABS 0.6 03/08/2024   EOSABS 0.1 03/08/2024   BASOSABS 0.1 03/08/2024      Last metabolic panel Lab Results  Component Value Date   NA 134 (L) 03/12/2024   K 4.4 03/12/2024   CL 101 03/12/2024   CO2 22 03/12/2024   BUN 16 03/12/2024   CREATININE 0.92 03/12/2024   GLUCOSE 100 (H) 03/12/2024   GFRNONAA >60 03/12/2024   CALCIUM   9.0 03/12/2024   PROT 6.0 (L) 03/12/2024   ALBUMIN 2.8 (L) 03/12/2024   BILITOT 0.7 03/12/2024   ALKPHOS 128 (H) 03/12/2024   AST 32 03/12/2024   ALT 82 (H) 03/12/2024   ANIONGAP 11 03/12/2024    CBG (last 3)  No results for input(s): GLUCAP in the last 72 hours.    Coagulation Profile: Recent Labs  Lab 03/08/24 0903  INR 0.9     Radiology Studies: I have personally reviewed the imaging studies  VAS US  ABI WITH/WO TBI Result Date: 03/12/2024  LOWER EXTREMITY DOPPLER STUDY Patient Name:  MEKEL HAVERSTOCK.  Date of Exam:   03/12/2024 Medical Rec #: [id number]              Accession #:    [id number] Date of Birth: 1970-08-25               Patient Gender: M Patient Age:   69 years Exam Location:  [facility] Procedure:      VAS US  ABI WITH/WO TBI Referring Phys: PENNE COLORADO --------------------------------------------------------------------------------  Indications: Claudication, and peripheral artery disease. Nearly occluded              bilateral internal iliac arteries. High Risk Factors: Hypertension, hyperlipidemia, current smoker, coronary artery                    disease. Other Factors: AAA.  Comparison Study: ABI from [facility] showed right ABI 0.79, left ABI 0.75 Performing Technologist: Ricka Sturdivant-Jones RDMS, RVT  Examination Guidelines: A complete evaluation includes at minimum, Doppler waveform signals and systolic blood pressure reading at the level of bilateral brachial, anterior tibial, and posterior tibial arteries, when vessel segments are accessible. Bilateral testing is considered an integral part of a complete examination. Photoelectric Plethysmograph (PPG) waveforms and toe systolic pressure  readings are included as required and additional duplex testing as needed. Limited examinations for reoccurring indications may be performed as noted.  ABI Findings: +---------+------------------+-----+----------+--------+ Right    Rt Pressure (mmHg)IndexWaveform  Comment  +---------+------------------+-----+----------+--------+ Brachial 107                    triphasic          +---------+------------------+-----+----------+--------+ PTA      68                0.64 monophasic         +---------+------------------+-----+----------+--------+ DP       69                0.64 monophasic         +---------+------------------+-----+----------+--------+ Great Toe25                0.23 Abnormal           +---------+------------------+-----+----------+--------+ +---------+------------------+-----+----------+-------+ Left     Lt Pressure (mmHg)IndexWaveform  Comment +---------+------------------+-----+----------+-------+ Brachial 105                    triphasic         +---------+------------------+-----+----------+-------+ PTA      72                0.67 monophasic        +---------+------------------+-----+----------+-------+ DP       67                0.63 monophasic        +---------+------------------+-----+----------+-------+ Burnetta Hesselbach  0.35 Abnormal          +---------+------------------+-----+----------+-------+ +-------+-----------+-----------+------------+------------+ ABI/TBIToday's ABIToday's TBIPrevious ABIPrevious TBI +-------+-----------+-----------+------------+------------+ Right  0.64       0.23                                +-------+-----------+-----------+------------+------------+ Left   0.67       0.35                                +-------+-----------+-----------+------------+------------+   *See table(s) above for measurements and observations.     Preliminary    CARDIAC CATHETERIZATION Result Date:  03/11/2024 Conclusions: Significant three-vessel coronary artery disease, as detailed below, including focal 80% mid LAD stenosis as well as chronic total occlusions of the mid LCx and proximal/mid RCA.  Distal LCx/OM1 and distal RCA are both collateralized. Low normal left and right heart filling pressures. Mildly to moderately reduced Fick cardiac output/index. Successful RFR-guided PCI to 80% mid LAD stenosis using Synergy 3.0 x 16 mm drug-eluting stent with 0% residual stenosis and TIMI-3 flow. Recommendations: Gentle postcatheterization hydration in the setting of low filling pressures and soft blood pressure. Dual antiplatelet therapy with aspirin  and ticagrelor  for at least 12 months. Aggressive secondary prevention of coronary and peripheral arterial disease. Escalate goal-directed medical therapy for HFrEF due to ischemic cardiomyopathy. Lonni Hanson, MD [facility]       Nydia Distance M.D. [facility] 03/12/2024, 2:38 PM  Available via Epic secure chat 7am-7pm After 7 pm, please refer to night coverage provider listed on amion.

## 2024-03-12 NOTE — Discharge Summary (Addendum)
 Physician Discharge Summary   Patient: Maxwell Rosales. MRN: [id number] DOB: 1970-12-31  Admit date:     03/08/2024  Discharge date: 03/12/24  Discharge Physician: Nydia Distance, MD    PCP: Glennon Sand, NP   Recommendations at discharge:    Started on ASA + brilinta   Coreg  3.125mg  BID  Entresto  24-26mg  PO BID  Outpatient follow-ups with cardiology and vascular surgery scheduled  Resume statin outpatient when LFT's normalized   Discharge Diagnoses:    Acute on chronic systolic CHF (congestive heart failure) (HCC)   Calculus of gallbladder with cholecystitis   Acute combined systolic and diastolic heart failure (HCC)   Ischemic cardiomyopathy   PAD (peripheral artery disease) (HCC)   Alcohol induced fatty liver   History of alcohol abuse   Primary hypertension   Tobacco abuse    Continuous dependence on cigarette smoking   Infrarenal abdominal aortic aneurysm (AAA) without rupture (HCC)   Benign hypertension   NSVT (nonsustained ventricular tachycardia) [facility])    Hospital Course:  Patient is a 53 year old male with history of alcohol use, quit 3 weeks ago, tobacco use, CAD, HTN, HLP, PAD, recent hospitalization and discharged on 8/23 due to alcohol withdrawals, another admission on 9/11 for chest pain thought secondary to acute cholecystitis, workup showed a 2D echo with EF of 35% with regional wall motion abnormalities. Patient presented to [name], ED with shortness of breath and chest pressure that woke him up from sleep, reported dyspnea on exertion.  No fever or chills, no leg extremity edema or hemoptysis.    In ED, BP 170/90, labs were significant for worsening LFTs, AST 251, ALT 244, total bili 1.4, seen by general surgery, HIDA scan no concern of acute cholecystitis, troponins mildly elevated at 18, EKG showing ST depression in inferior and lateral leads, cardiology were consulted who recommended transfer to [facility] for cardiac cath on Monday.       Assessment and Plan:   Chest pain  New ischemic cardiomyopathy, EF 35 to 40% - Presented with chest pain, mild elevation in troponin with EKG showing inferior lateral ST depressions. - Per cardiology, ACS ruled out  - Underwent cardiac cath on 9/15 status post PCI to focal 80% mid LAD and CTO RCA and circumflex - Cardiology following, plan aspirin , Brilinta  for 1 year, coreg , entresto  Resume statin outpatient if LFT's normalized      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 failure) (HCC) New cardiomyopathy with a EF 35 to 40%, likely ischemic cardiomyopathy - placed on IV Lasix , strict I's and O's and daily weights -Negative balance of 2.8 L - Per cardiology, continue Coreg , Entresto , Jardiance , would dc on Lasix  20 mg as needed     Severe PAD  - Diagnosed with PAD, was supposed to follow-up with Dr. Silver on 9/11  - Appointment has been rescheduled with Dr. Gretta on 10/15.   - Patient and family requested inpatient vascular surgery consult for further workup as patient has missed appointments due to hospitalization - Seen by vascular surgery, recommended to stop smoking, out patient follow-up and repeat CTA for further evaluation    Hypertension, poorly controlled - BP stable   Transaminitis Fatty liver Acute cholecystitis? - Recent admission with CT chest and ultrasound with some concern for acute cholecystitis. Patient was seen by general surgery at [facility], recommended extensive cardiology workup should the patient require surgical intervention, recommended HIDA - HIDA scan normal, with filling of gallbladder indicating cystic duct patency -  LFTs improving, continue to monitor   Tobacco abuse -counseled to continue with nicotine  patch   Infrarenal AAA 3.4 cm -Will need surveillance every 3 years   Alcohol use - abstinence from alcohol since recently discharged on 8/23, so no concern for withdrawals - Continue thiamine  and folic  acid     Estimated body mass index is 24.18 kg/m as calculated from the following:   Height as of this encounter: 5' 10 (1.778 m).   Weight as of this encounter: 76.4 kg.     Pain control - [place]  Controlled Substance Reporting System database was reviewed. and patient was instructed, not to drive, operate heavy machinery, perform activities at heights, swimming or participation in water  activities or provide baby-sitting services while on Pain, Sleep and Anxiety Medications; until their outpatient Physician has advised to do so again. Also recommended to not to take more than prescribed Pain, Sleep and Anxiety Medications.  Consultants: Cardiology, surgery, Vascular surgery  Procedures performed: cath   Disposition: Home Diet recommendation:  Discharge Diet Orders (From admission, onward)     Start     Ordered   03/12/24 0000  Diet - low sodium heart healthy        03/12/24 1658            DISCHARGE MEDICATION: Allergies as of 03/12/2024   No Known Allergies      Medication List     STOP taking these medications    ibuprofen  200 MG tablet Commonly known as: ADVIL    metoprolol  tartrate 25 MG tablet Commonly known as: LOPRESSOR    traZODone  150 MG tablet Commonly known as: DESYREL        TAKE these medications    amoxicillin -clavulanate 875-125 MG tablet Commonly known as: AUGMENTIN  Take 1 tablet by mouth 2 (two) times daily for 4 days.   aspirin  EC 81 MG tablet Take 1 tablet (81 mg total) by mouth daily with breakfast. Swallow whole.   carvedilol  3.125 MG tablet Commonly known as: COREG  Take 1 tablet (3.125 mg total) by mouth 2 (two) times daily with a meal.   empagliflozin  10 MG Tabs tablet Commonly known as: JARDIANCE  Take 1 tablet (10 mg total) by mouth daily. Start taking on: March 13, 2024   folic acid  1 MG tablet Commonly known as: FOLVITE  Take 1 tablet (1 mg total) by mouth daily.   furosemide  20 MG tablet Commonly known as:  Lasix  Take 1 tablet (20 mg total) by mouth daily as needed for fluid or edema.   gabapentin  100 MG capsule Commonly known as: NEURONTIN  Take 1 capsule (100 mg total) by mouth 3 (three) times daily.   NEURIVA PO Take 1 capsule by mouth daily. For nerve pain   nicotine  21 mg/24hr patch Commonly known as: NICODERM CQ  - dosed in mg/24 hours Place 1 patch (21 mg total) onto the skin daily.   nitroGLYCERIN  0.4 MG SL tablet Commonly known as: NITROSTAT  Place 1 tablet (0.4 mg total) under the tongue every 5 (five) minutes as needed for chest pain.   oxyCODONE  5 MG immediate release tablet Commonly known as: Oxy IR/ROXICODONE  Take 1 tablet (5 mg total) by mouth every 6 (six) hours as needed for moderate pain (pain score 4-6) or severe pain (pain score 7-10).   sacubitril -valsartan  24-26 MG Commonly known as: ENTRESTO  Take 1 tablet by mouth 2 (two) times daily.   ticagrelor  90 MG Tabs tablet Commonly known as: BRILINTA  Take 1 tablet (90 mg total) by mouth 2 (two) times daily.  zolpidem  10 MG tablet Commonly known as: AMBIEN  Take 1 tablet (10 mg total) by mouth at bedtime as needed for sleep.        Follow-up Information     [facility]. Go on 03/19/2024.   Specialty: Cardiology Why: Hospital Follow-Up 03/19/24 @ 3:00 PM Please bring all medications to follow-up appt [facility], [address], Second Floor Free Valet Parking at the door Contact information: [address] [place]  [address] [phone]        Glennon Sand, NP. Schedule an appointment as soon as possible for a visit in 2 week(s).   Specialty: Nurse Practitioner Why: for hospital follow-up Contact information: [address] [place] [address] [phone]         Gretta Lonni PARAS, MD Follow up on 04/10/2024.   Specialty: Vascular Surgery Contact  information: [address] [place] [address] [phone]                Discharge Exam: Fredricka Weights   03/10/24 0500 03/11/24 0445 03/12/24 0628  Weight: 76.4 kg 76.1 kg 76.4 kg   S: no acute complaints, cleared by cardiology and vascular surgery to discharge home   BP 109/84 (BP Location: Left Arm)   Pulse 78   Temp 97.6 F (36.4 C) (Oral)   Resp 20   Ht 5' 10 (1.778 m)   Wt 76.4 kg   SpO2 97%   BMI 24.18 kg/m   Physical Exam General: Alert and oriented x 3, NAD Cardiovascular: S1 S2 clear, RRR.  Respiratory: CTAB, no wheezing, rales or rhonchi Gastrointestinal: Soft, nontender, nondistended, NBS Ext: no pedal edema bilaterally Neuro: no new deficits Skin: No rashes Psych: Normal affect    Condition at discharge: fair  The results of significant diagnostics from this hospitalization (including imaging, microbiology, ancillary and laboratory) are listed below for reference.   Imaging Studies: VAS US  ABI WITH/WO TBI Result Date: 03/12/2024  LOWER EXTREMITY DOPPLER STUDY Patient Name:  MARQUS MACPHEE.  Date of Exam:   03/12/2024 Medical Rec #: [id number]              Accession #:    [id number] Date of Birth: [birth year]               Patient Gender: M Patient Age:   47 years Exam Location:  [facility] Procedure:      VAS US  ABI WITH/WO TBI Referring Phys: PENNE COLORADO --------------------------------------------------------------------------------  Indications: Claudication, and peripheral artery disease. Nearly occluded              bilateral internal iliac arteries. High Risk Factors: Hypertension, hyperlipidemia, current smoker, coronary artery                    disease. Other Factors: AAA.  Comparison Study: ABI from [facility] showed right ABI 0.79, left ABI 0.75 Performing Technologist: Ricka Sturdivant-Jones RDMS, RVT  Examination Guidelines: A complete evaluation includes at minimum, Doppler waveform signals and systolic blood pressure  reading at the level of bilateral brachial, anterior tibial, and posterior tibial arteries, when vessel segments are accessible. Bilateral testing is considered an integral part of a complete examination. Photoelectric Plethysmograph (PPG) waveforms and toe systolic pressure readings are included as required and additional duplex testing as needed. Limited examinations for reoccurring indications may be performed as noted.  ABI Findings: +---------+------------------+-----+----------+--------+ Right    Rt Pressure (  mmHg)IndexWaveform  Comment  +---------+------------------+-----+----------+--------+ Brachial 107                    triphasic          +---------+------------------+-----+----------+--------+ PTA      68                0.64 monophasic         +---------+------------------+-----+----------+--------+ DP       69                0.64 monophasic         +---------+------------------+-----+----------+--------+ Great Toe25                0.23 Abnormal           +---------+------------------+-----+----------+--------+ +---------+------------------+-----+----------+-------+ Left     Lt Pressure (mmHg)IndexWaveform  Comment +---------+------------------+-----+----------+-------+ Brachial 105                    triphasic         +---------+------------------+-----+----------+-------+ PTA      72                0.67 monophasic        +---------+------------------+-----+----------+-------+ DP       67                0.63 monophasic        +---------+------------------+-----+----------+-------+ Great Toe37                0.35 Abnormal          +---------+------------------+-----+----------+-------+ +-------+-----------+-----------+------------+------------+ ABI/TBIToday's ABIToday's TBIPrevious ABIPrevious TBI +-------+-----------+-----------+------------+------------+ Right  0.64       0.23                                 +-------+-----------+-----------+------------+------------+ Left   0.67       0.35                                +-------+-----------+-----------+------------+------------+  *See table(s) above for measurements and observations.  Electronically signed by Penne Colorado MD on 03/12/2024 at 4:31:59 PM.    Final    CARDIAC CATHETERIZATION Result Date: 03/11/2024 Conclusions: Significant three-vessel coronary artery disease, as detailed below, including focal 80% mid LAD stenosis as well as chronic total occlusions of the mid LCx and proximal/mid RCA.  Distal LCx/OM1 and distal RCA are both collateralized. Low normal left and right heart filling pressures. Mildly to moderately reduced Fick cardiac output/index. Successful RFR-guided PCI to 80% mid LAD stenosis using Synergy 3.0 x 16 mm drug-eluting stent with 0% residual stenosis and TIMI-3 flow. Recommendations: Gentle postcatheterization hydration in the setting of low filling pressures and soft blood pressure. Dual antiplatelet therapy with aspirin  and ticagrelor  for at least 12 months. Aggressive secondary prevention of coronary and peripheral arterial disease. Escalate goal-directed medical therapy for HFrEF due to ischemic cardiomyopathy. Lonni Hanson, MD [facility]  NM Hepatobiliary Liver Func Result Date: 03/08/2024 CLINICAL DATA:  Cholelithiasis.  Abdominal discomfort EXAM: NUCLEAR MEDICINE HEPATOBILIARY IMAGING TECHNIQUE: Sequential images of the abdomen were obtained out to 60 minutes following intravenous administration of radiopharmaceutical. RADIOPHARMACEUTICALS:  4.7 mCi Tc-73m  Choletec  IV COMPARISON:  Abdominal ultrasound 03/07/2024 FINDINGS: Prompt uptake of radiopharmaceutical from the blood pool. Biliary activity faintly visible at 10 minutes with bowel activity at 17 minutes. Gallbladder activity first becomes visible at  25 minutes. This indicates cystic duct patency and common bile duct patency. IMPRESSION: 1. Normal hepatobiliary  scan, with filling of the gallbladder indicating cystic duct patency. Electronically Signed   By: Ryan Salvage M.D.   On: 03/08/2024 15:36   ECHOCARDIOGRAM COMPLETE Result Date: 03/07/2024    ECHOCARDIOGRAM REPORT   Patient Name:   Pharrell Ledford. Date of Exam: 03/07/2024 Medical Rec #:  [id number]             Height:       70.0 in Accession #:    [id number]            Weight:       183.0 lb Date of Birth:  [birth year]              BSA:          2.010 m Patient Age:    53 years              BP:           135/78 mmHg Patient Gender: M                     HR:           82 bpm. Exam Location:  [name] Procedure: 2D Echo, 3D Echo, Cardiac Doppler, Color Doppler and Strain Analysis            (Both Spectral and Color Flow Doppler were utilized during            procedure). Indications:    Chest Pain R07.9  History:        Patient has no prior history of Echocardiogram examinations.                 Arrythmias:LBBB; Risk Factors:Hypertension and Current Smoker.  Sonographer:    Aida Pizza RCS Referring Phys: [phone] DAWOOD GORMAN LYE  Sonographer Comments: Global longitudinal strain was attempted. IMPRESSIONS  1. Left ventricular ejection fraction, by estimation, is 35 to 40%. Left ventricular ejection fraction by 3D volume is 40 %. The left ventricle has moderately decreased function. The left ventricle demonstrates regional wall motion abnormalities (see scoring diagram/findings for description). The left ventricular internal cavity size was moderately to severely dilated. Left ventricular diastolic parameters are consistent with Grade I diastolic dysfunction (impaired relaxation). Elevated left ventricular end-diastolic pressure. The average left ventricular global longitudinal strain is -11.0 %. The global longitudinal strain is abnormal.  2. Right ventricular systolic function is normal. The right ventricular size is normal. Tricuspid regurgitation signal is inadequate for assessing PA pressure.  3. The  mitral valve is normal in structure. Trivial mitral valve regurgitation. No evidence of mitral stenosis.  4. The aortic valve is tricuspid. Aortic valve regurgitation is not visualized. No aortic stenosis is present.  5. The inferior vena cava is dilated in size with >50% respiratory variability, suggesting right atrial pressure of 8 mmHg. Comparison(s): No prior Echocardiogram. FINDINGS  Left Ventricle: Left ventricular ejection fraction, by estimation, is 35 to 40%. Left ventricular ejection fraction by 3D volume is 40 %. The left ventricle has moderately decreased function. The left ventricle demonstrates regional wall motion abnormalities. The average left ventricular global longitudinal strain is -11.0 %. Strain was performed and the global longitudinal strain is abnormal. The left ventricular internal cavity size was moderately to severely dilated. There is no left ventricular hypertrophy. Left ventricular diastolic parameters are consistent with Grade I diastolic dysfunction (impaired relaxation). Elevated left ventricular  end-diastolic pressure.  LV Wall Scoring: The entire lateral wall, inferior wall, and basal inferoseptal segment are akinetic. The anterior septum, mid inferoseptal segment, and basal anterior segment are hypokinetic. The mid and distal anterior wall, apical septal segment, apical inferior segment, and apex are normal. Right Ventricle: The right ventricular size is normal. No increase in right ventricular wall thickness. Right ventricular systolic function is normal. Tricuspid regurgitation signal is inadequate for assessing PA pressure. Left Atrium: Left atrial size was normal in size. Right Atrium: Right atrial size was normal in size. Pericardium: There is no evidence of pericardial effusion. Mitral Valve: The mitral valve is normal in structure. Trivial mitral valve regurgitation. No evidence of mitral valve stenosis. Tricuspid Valve: The tricuspid valve is normal in structure.  Tricuspid valve regurgitation is trivial. No evidence of tricuspid stenosis. Aortic Valve: The aortic valve is tricuspid. Aortic valve regurgitation is not visualized. No aortic stenosis is present. Pulmonic Valve: The pulmonic valve was normal in structure. Pulmonic valve regurgitation is not visualized. No evidence of pulmonic stenosis. Aorta: The aortic root is normal in size and structure. Venous: The inferior vena cava is dilated in size with greater than 50% respiratory variability, suggesting right atrial pressure of 8 mmHg. IAS/Shunts: No atrial level shunt detected by color flow Doppler. Additional Comments: 3D was performed not requiring image post processing on an independent workstation and was abnormal.  LEFT VENTRICLE PLAX 2D LVIDd:         6.65 cm         Diastology LVIDs:         5.30 cm         LV e' medial:    4.21 cm/s LV PW:         1.15 cm         LV E/e' medial:  17.4 LV IVS:        1.05 cm         LV e' lateral:   5.52 cm/s LVOT diam:     1.80 cm         LV E/e' lateral: 13.2 LV SV:         58 LV SV Index:   29              2D Longitudinal LVOT Area:     2.54 cm        Strain                                2D Strain GLS   -11.0 %                                Avg: LV Volumes (MOD) LV vol d, MOD    177.0 ml      3D Volume EF A2C:                           LV 3D EF:    Left LV vol d, MOD    240.0 ml                   ventricul A4C:  ar LV vol s, MOD    112.0 ml                   ejection A2C:                                        fraction LV vol s, MOD    142.0 ml                   by 3D A4C:                                        volume is LV SV MOD A2C:   65.0 ml                    40 %. LV SV MOD A4C:   240.0 ml LV SV MOD BP:    85.9 ml                                3D Volume EF:                                3D EF:        40 %                                LV EDV:       226 ml                                LV ESV:       137 ml                                 LV SV:        90 ml RIGHT VENTRICLE RV S prime:     12.60 cm/s TAPSE (M-mode): 2.0 cm LEFT ATRIUM             Index        RIGHT ATRIUM           Index LA diam:        4.40 cm 2.19 cm/m   RA Area:     15.10 cm LA Vol (A2C):   46.2 ml 22.99 ml/m  RA Volume:   37.70 ml  18.76 ml/m LA Vol (A4C):   68.1 ml 33.88 ml/m LA Biplane Vol: 57.0 ml 28.36 ml/m  AORTIC VALVE LVOT Vmax:   107.00 cm/s LVOT Vmean:  66.500 cm/s LVOT VTI:    0.227 m  AORTA Ao Root diam: 3.40 cm MITRAL VALVE MV Area (PHT): 2.69 cm       SHUNTS MV Decel Time: 282 msec       Systemic VTI:  0.23 m MR Peak grad:    65.6 mmHg    Systemic Diam: 1.80 cm MR Mean grad:    42.0 mmHg MR Vmax:         405.00 cm/s MR Vmean:        304.0 cm/s  MR PISA:         2.26 cm MR PISA Eff ROA: 12 mm MR PISA Radius:  0.60 cm MV E velocity: 73.10 cm/s MV A velocity: 94.40 cm/s MV E/A ratio:  0.77 Vishnu Priya Mallipeddi Electronically signed by Diannah Late Mallipeddi Signature Date/Time: 03/07/2024/4:26:41 PM    Final    US  Abdomen Limited RUQ (LIVER/GB) Result Date: 03/07/2024 CLINICAL DATA:  Acute cholecystitis EXAM: ULTRASOUND ABDOMEN LIMITED RIGHT UPPER QUADRANT COMPARISON:  02/13/2024 CT scan FINDINGS: Gallbladder: Abnormal gallbladder wall thickening 5 mm in single wall thickening. Sonographic Murphy's sign absent. Tumefactive sludge fills most of the gallbladder. Trace pericholecystic fluid along the anterior wall of the gallbladder. Common bile duct: Diameter: 0.3 cm Liver: No focal lesion identified. Coarse echogenic liver with poor sonic penetration compatible with diffuse hepatic steatosis. Portal vein is patent on color Doppler imaging with normal direction of blood flow towards the liver. Other: None. IMPRESSION: 1. Abnormal gallbladder wall thickening with tumefactive sludge filling most of the gallbladder. Trace pericholecystic fluid along the anterior wall of the gallbladder. Sonographic Murphy's sign absent. Findings are equivocal for acute  cholecystitis. 2. Coarse echogenic liver with poor sonic penetration compatible with diffuse hepatic steatosis. Electronically Signed   By: Ryan Salvage M.D.   On: 03/07/2024 11:35   CT Angio Chest PE W and/or Wo Contrast Result Date: 03/06/2024 CLINICAL DATA:  Chest pain while driving this afternoon, short of breath, diaphoresis, lower extremity numbness EXAM: CT ANGIOGRAPHY CHEST WITH CONTRAST TECHNIQUE: Multidetector CT imaging of the chest was performed using the standard protocol during bolus administration of intravenous contrast. Multiplanar CT image reconstructions and MIPs were obtained to evaluate the vascular anatomy. RADIATION DOSE REDUCTION: This exam was performed according to the departmental dose-optimization program which includes automated exposure control, adjustment of the mA and/or kV according to patient size and/or use of iterative reconstruction technique. CONTRAST:  75mL OMNIPAQUE  IOHEXOL  350 MG/ML SOLN COMPARISON:  02/13/2024 FINDINGS: Cardiovascular: This is a technically adequate evaluation of the pulmonary vasculature. No filling defects or pulmonary emboli. Stable cardiomegaly and left ventricular dilatation. No pericardial effusion. No evidence of thoracic aortic aneurysm or dissection. Atherosclerosis of the aorta and coronary vasculature. Mediastinum/Nodes: No enlarged mediastinal, hilar, or axillary lymph nodes. Thyroid gland, trachea, and esophagus demonstrate no significant findings. Lungs/Pleura: Subpleural blebs within the bilateral apices. No acute airspace disease, effusion, or pneumothorax. The central airways are patent. Upper Abdomen: There is high density material within the gallbladder consistent with gallbladder sludge. Gallbladder wall is thickened, with pericholecystic fat stranding, consistent with acute cholecystitis. Please correlate with laboratory evaluation and physical exam findings. Musculoskeletal: No acute or destructive bony abnormalities.  Reconstructed images demonstrate no additional findings. Review of the MIP images confirms the above findings. IMPRESSION: 1. Abnormal appearance of the gallbladder consistent with gallbladder sludge and acute cholecystitis. Please correlate with physical exam findings and laboratory evaluation. If further imaging is required, right upper quadrant ultrasound could be performed. 2. No evidence of pulmonary embolus. 3. Stable cardiomegaly and left ventricular dilatation. 4. Aortic Atherosclerosis (ICD10-I70.0). Coronary artery atherosclerosis. Electronically Signed   By: Ozell Daring M.D.   On: 03/06/2024 19:14   DG Chest Port 1 View Result Date: 03/06/2024 CLINICAL DATA:  Chest pain, diaphoresis, shortness of breath EXAM: PORTABLE CHEST 1 VIEW COMPARISON:  02/13/2024 FINDINGS: Single frontal view of the chest demonstrates an unremarkable cardiac silhouette. No acute airspace disease, effusion, or pneumothorax. No acute bony abnormalities. IMPRESSION: 1. No acute intrathoracic process. Electronically Signed   By:  Ozell Daring M.D.   On: 03/06/2024 16:26   US  ARTERIAL ABI (SCREENING LOWER EXTREMITY) Result Date: 02/13/2024 CLINICAL DATA:  53 year old male with bilateral lower extremity pain and weakness EXAM: NONINVASIVE PHYSIOLOGIC VASCULAR STUDY OF BILATERAL LOWER EXTREMITIES TECHNIQUE: Evaluation of both lower extremities were performed at rest, including calculation of ankle-brachial indices with single level Doppler, pressure and pulse volume recording. COMPARISON:  CT scan of the abdomen and pelvis 02/13/2024 FINDINGS: Right ABI:  0.79 Left ABI:  0.75 Right Lower Extremity: Abnormal monophasic arterial waveform at the ankle. Left Lower Extremity: Abnormal monophasic arterial waveform at the ankle. 0.5-0.79 Moderate PAD IMPRESSION: 1. Symmetrically abnormal bilateral ankle-brachial indices consistent with at least moderate underlying peripheral arterial disease. 2. Correlation with prior CT imaging  confirms aortoiliac occlusive disease. Electronically Signed   By: Wilkie Lent M.D.   On: 02/13/2024 11:08   CT Angio Chest PE W and/or Wo Contrast Result Date: 02/13/2024 EXAM: CTA CHEST AORTA 02/13/2024 09:10:22 AM TECHNIQUE: CTA of the chest was performed after the administration of intravenous contrast. Multiplanar reformatted images are provided for review. MIP images are provided for review. Automated exposure control, iterative reconstruction, and/or weight based adjustment of the mA/kV was utilized to reduce the radiation dose to as low as reasonably achievable. COMPARISON: None available. CLINICAL HISTORY: Pulmonary embolism (PE) suspected, high prob. Pt c/o vomiting, generalized weakness, and lightheadedness x today, pt does endorse smoking 2 packs of cigarettes/day and ETOH use daily. Denies CP and shob at this time. FINDINGS: AORTA: No thoracic aortic dissection. No aneurysm. There is mild calcific atheromatous disease within the aortic arch. MEDIASTINUM: No mediastinal lymphadenopathy. The heart is mildly enlarged. There is moderate calcific coronary artery disease present. LYMPH NODES: No mediastinal, hilar or axillary lymphadenopathy. LUNGS AND PLEURA: There are peripheral pulmonary blebs present. There are ground-glass opacities present dependently within the lower lobes bilaterally. No focal consolidation or pulmonary edema. No pleural effusion or pneumothorax. UPPER ABDOMEN: Limited images of the upper abdomen are unremarkable. SOFT TISSUES AND BONES: No acute bone or soft tissue abnormality. IMPRESSION: 1. No evidence of pulmonary embolism. 2. Mildly enlarged heart with moderate calcific coronary artery disease. 3. Mild calcific atheromatous disease within the aortic arch. Electronically signed by: Evalene Coho MD 02/13/2024 09:43 AM EDT RP Workstation: [id number]   CT ABDOMEN PELVIS W CONTRAST Result Date: 02/13/2024 EXAM: CT ABDOMEN AND PELVIS WITH CONTRAST 02/13/2024 09:10:22 AM  TECHNIQUE: CT of the abdomen and pelvis was performed with the administration of intravenous contrast. Multiplanar reformatted images are provided for review. Automated exposure control, iterative reconstruction, and/or weight based adjustment of the mA/kV was utilized to reduce the radiation dose to as low as reasonably achievable. COMPARISON: None available. CLINICAL HISTORY: Nausea/vomiting, elevated LFTs. Pt c/o vomiting, generalized weakness, and lightheadedness x today, pt does endorse smoking 2 packs of cigarettes/day and ETOH use daily. Denies CP and shob at this time. FINDINGS: LOWER CHEST: No acute abnormality. LIVER: There is marked fatty infiltration of the liver. GALLBLADDER AND BILE DUCTS: There is radiopaque debris layering dependently within the gallbladder. No biliary ductal dilatation. SPLEEN: No acute abnormality. PANCREAS: No acute abnormality. ADRENAL GLANDS: No acute abnormality. KIDNEYS, URETERS AND BLADDER: No stones in the kidneys or ureters. No hydronephrosis. No perinephric or periureteral stranding. Urinary bladder is unremarkable. GI AND BOWEL: There are numerous scattered colonic diverticula present. Stomach demonstrates no acute abnormality. There is no bowel obstruction. No bowel wall thickening. PERITONEUM AND RETROPERITONEUM: No ascites. No free air. VASCULATURE: There is mild fusiform  aneurysmal dilatation of the abdominal aorta which measures up to 3.4 x 2.9 cm in cross-section. There is extensive calcific and noncalcific plaque present within the abdominal aorta. There is near occlusive stenosis of the aortic bifurcation and origins of the internal iliac arteries bilaterally. LYMPH NODES: No lymphadenopathy. REPRODUCTIVE ORGANS: No acute abnormality. BONES AND SOFT TISSUES: No acute osseous abnormality. No focal soft tissue abnormality. IMPRESSION: 1. 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 2. Mild fusiform  aneurysmal dilatation of the abdominal aorta measuring up to 3.4 x 2.9 cm in cross-section, with extensive calcific and noncalcific plaque. 3. Marked hepatic steatosis. 4. Radiopaque debris layering dependently within the gallbladder. Electronically signed by: Evalene Coho MD 02/13/2024 09:41 AM EDT RP Workstation: [id number]   CT Head Wo Contrast Result Date: 02/13/2024 EXAM: CT HEAD WITHOUT CONTRAST 02/13/2024 09:10:22 AM TECHNIQUE: CT of the head was performed without the administration of intravenous contrast. Automated exposure control, iterative reconstruction, and/or weight based adjustment of the mA/kV was utilized to reduce the radiation dose to as low as reasonably achievable. COMPARISON: None available. CLINICAL HISTORY: Visual changes, balance issues. Patient complains of vomiting, generalized weakness, and lightheadedness today. Patient endorses smoking 2 packs of cigarettes per day and alcohol use daily. Denies chest pain and shortness of breath at this time. FINDINGS: BRAIN AND VENTRICLES: No acute hemorrhage. Gray-white differentiation is preserved. No hydrocephalus. No extra-axial collection. No mass effect or midline shift. ORBITS: No acute abnormality. SINUSES: No acute abnormality. SOFT TISSUES AND SKULL: No acute soft tissue abnormality. No skull fracture. VASCULATURE: There are calcifications within the carotid siphons. IMPRESSION: 1. No acute intracranial abnormality. 2. Calcifications within the carotid siphons. Electronically signed by: Evalene Coho MD 02/13/2024 09:25 AM EDT RP Workstation: [id number]   DG Chest Portable 1 View Result Date: 02/13/2024 CLINICAL DATA:  SOB EXAM: DG CHEST 1V PORT COMPARISON:  None available. FINDINGS: No focal airspace consolidation, pleural effusion, or pneumothorax. No cardiomegaly. No acute fracture or destructive lesions. Multilevel thoracic osteophytosis. IMPRESSION: No acute cardiopulmonary abnormality. Electronically Signed   By: Rogelia Myers M.D.   On: 02/13/2024 08:24    Microbiology: No results found for this or any previous visit.  Labs: CBC: Recent Labs  Lab 03/06/24 1458 03/07/24 0430 03/08/24 0903 03/09/24 0227 03/10/24 0230 03/11/24 0828 03/11/24 1624 03/11/24 1630 03/12/24 0622  WBC 12.8*   < > 10.2 6.0 16.3* 6.8  --   --  7.5  NEUTROABS 9.8*  --  8.1*  --   --   --   --   --   --   HGB 14.6   < > 14.2 14.3 15.1 15.1 16.0 16.0 15.2  HCT 41.3   < > 41.1 41.7 44.4 44.4 47.0 47.0 44.1  MCV 105.9*   < > 104.1* 104.0* 103.7* 103.7*  --   --  103.5*  PLT 210   < > 183 169 175 156  --   --  153   < > = values in this interval not displayed.   Basic Metabolic Panel: Recent Labs  Lab 03/07/24 0437 03/08/24 0903 03/09/24 0227 03/09/24 1431 03/10/24 0230 03/11/24 0828 03/11/24 1624 03/11/24 1630 03/12/24 0622  NA  --  134* 135  --  134* 135 135 134* 134*  K  --  3.7 3.4*  --  3.9 4.4 4.4 4.4 4.4  CL  --  101 99  --  98 100  --   --  101  CO2  --  24 24  --  25 23  --   --  22  GLUCOSE  --  117* 104*  --  100* 94  --   --  100*  BUN  --  8 7  --  8 13  --   --  16  CREATININE  --  0.62 0.76  --  0.80 0.84  --   --  0.92  CALCIUM   --  9.3 8.4*  --  9.0 8.9  --   --  9.0  MG 1.7  --   --  1.5*  --   --   --   --   --    Liver Function Tests: Recent Labs  Lab 03/08/24 0903 03/09/24 0227 03/10/24 0230 03/11/24 0828 03/12/24 0622  AST 251* 109* 106* 39 32  ALT 244* 199* 161* 107* 82*  ALKPHOS 178* 154* 174* 144* 128*  BILITOT 1.4* 0.7 1.0 0.7 0.7  PROT 6.2* 5.5* 5.8* 5.9* 6.0*  ALBUMIN 2.9* 2.6* 2.7* 2.8* 2.8*   CBG: No results for input(s): GLUCAP in the last 168 hours.  Discharge time spent: greater than 30 minutes.  Signed: Nydia Distance, MD [facility] 03/12/2024

## 2024-03-12 NOTE — Consult Note (Addendum)
 Hospital Consult    Reason for Consult:  leg pain Requesting Physician:  Davia MRN #:  [id number]  History of Present Illness: This is a 53 y.o. male who presented to the hospital with chest pressure.    He has  past medical history of alcohol abuse, tobacco abuse, CAD, hypertension, hyperlipidemia, PAD, recent hospitalization, discharged 8/23 secondary to alcohol withdrawals, had total alcohol abstinence since.  He has hx of CHF with new cardiomyopathy with EF of 35 to 40% and felt likely to be ischemic cardiomyopathy.  He has been placed on BB, Entresto .   He was discharged from the [facility] on 03/07/2024.  He was found to have chest/upper abdominal pain with associated elevated liver enzymes that was concerning for acute cholecystitis.  CT imaging confirmed  evidence of gallbladder sludge and appearance consistent with acute cholecystitis; this was supported on subsequent RUQ ultrasound.  General surgery recommended discharge with abx and f/u as outpatient.    He returned to the hospital the next day with c/o chest pain and shortness of breath that woke him from his sleep.  He underwent cardiac catheterization and had RFR guided PCI to 80% mid LAD stenosis with drug eluting stent.  He was placed on dual antiplatelet therapy for 12 months.    He has been diagnosed with severe PAD and had findings of infrarenal AAA of 3.4cm on CT scan in August. and was scheduled to be seen in our office, however, he was hospitalized and this was rescheduled.  This CT scan also revealed a near occlusive stenosis of the aortic bifurcation and origins of the internal iliac arteries bilaterally.    He states that he has been having a burning pain in his legs and feet and like a knife stabbing him.  He denies any severe rest pain that wakes him at night.  He does not endorse calf or thigh claudication.  He denies any non healing wounds.  He states that since he quit drinking, the pain in his legs is not as bad.  He  does continue to smoke.  He states he has read that people with decreased blood flow are at risk of amputations.    The pt is not on a statin for cholesterol management.  The pt is on a daily aspirin .   Other AC:  Brilinta  The pt is on BB, ARB for hypertension.   The pt is not on medication for diabetes PTA. 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   Shoulder dislocation     Past Surgical History:  Procedure Laterality Date   APPENDECTOMY     CORONARY PRESSURE/FFR STUDY N/A 03/11/2024   Procedure: CORONARY PRESSURE/FFR STUDY;  Surgeon: Mady Bruckner, MD;  Location: [facility] INVASIVE CV LAB;  Service: Cardiovascular;  Laterality: N/A;   CORONARY STENT INTERVENTION N/A 03/11/2024   Procedure: CORONARY STENT INTERVENTION;  Surgeon: Mady Bruckner, MD;  Location: [facility] INVASIVE CV LAB;  Service: Cardiovascular;  Laterality: N/A;   RIGHT/LEFT HEART CATH AND CORONARY ANGIOGRAPHY N/A 03/11/2024   Procedure: RIGHT/LEFT HEART CATH AND CORONARY ANGIOGRAPHY;  Surgeon: Mady Bruckner, MD;  Location: [facility] INVASIVE CV LAB;  Service: Cardiovascular;  Laterality: N/A;    No Known Allergies  Prior to Admission medications   Medication Sig Start Date End Date Taking? Authorizing Provider  aspirin  EC 81 MG tablet Take 1 tablet (81 mg total) by mouth daily with breakfast. Swallow whole. 02/17/24  Yes  Pearlean Manus, MD  folic acid  (FOLVITE ) 1 MG tablet Take 1 tablet (1 mg total) by mouth daily. 03/01/24  Yes Boswell, Chelsa, NP  ibuprofen  (ADVIL ) 200 MG tablet Take 400-600 mg by mouth every 6 (six) hours as needed for mild pain (pain score 1-3).   Yes [provider]  metoprolol  tartrate (LOPRESSOR ) 25 MG tablet Take 1 tablet (25 mg total) by mouth 2 (two) times daily. 03/01/24 03/01/25 Yes Glennon Sand, NP  Misc Natural Products (NEURIVA PO) Take 1 capsule by mouth daily. For nerve pain   Yes [provider]  nicotine  (NICODERM CQ  -  DOSED IN MG/24 HOURS) 21 mg/24hr patch Place 1 patch (21 mg total) onto the skin daily. 02/18/24  Yes Emokpae, Courage, MD  traZODone  (DESYREL ) 150 MG tablet Take 1 tablet (150 mg total) by mouth at bedtime. 03/01/24   Glennon Sand, NP    Social History   Socioeconomic History   Marital status: Married    Spouse name: Not on file   Number of children: Not on file   Years of education: Not on file   Highest education level: Not on file  Occupational History   Not on file  Tobacco Use   Smoking status: Every Day   Smokeless tobacco: Not on file  Vaping Use   Vaping status: Never Used  Substance and Sexual Activity   Alcohol use: Not Currently    Comment: patient reports no etoh since 01/2024 admission for alcoholic hepatitis/withdrawal   Drug use: No   Sexual activity: Yes  Other Topics Concern   Not on file  Social History Narrative   Not on file   Social Drivers of Health   Financial Resource Strain: Not on file  Food Insecurity: No Food Insecurity (03/08/2024)   Hunger Vital Sign    Worried About Running Out of Food in the Last Year: Never true    Ran Out of Food in the Last Year: Never true  Transportation Needs: No Transportation Needs (03/08/2024)   PRAPARE - Administrator, Civil Service (Medical): No    Lack of Transportation (Non-Medical): No  Physical Activity: Not on file  Stress: Not on file  Social Connections: Moderately Integrated (03/06/2024)   Social Connection and Isolation Panel    Frequency of Communication with Friends and Family: More than three times a week    Frequency of Social Gatherings with Friends and Family: Three times a week    Attends Religious Services: Never    Active Member of Clubs or Organizations: No    Attends Banker Meetings: 1 to 4 times per year    Marital Status: Married  Catering manager Violence: Not At Risk (03/08/2024)   Humiliation, Afraid, Rape, and Kick questionnaire    Fear of Current or  Ex-Partner: No    Emotionally Abused: No    Physically Abused: No    Sexually Abused: No     Family History  Problem Relation Age of Onset   Colon cancer Neg Hx    Cirrhosis Neg Hx     ROS: [x]  Positive   [ ]  Negative   [ ]  All sytems reviewed and are negative  Cardiac: [x]  chest pain/pressure [x]  CAD [x]  SOB  [x]  CHF  Vascular: [x]  pain in legs while walking []  pain in legs at rest []  pain in legs at night []  non-healing ulcers []  hx of DVT []  swelling in legs  Pulmonary: []  asthma/wheezing []  home O2  Neurologic: []   hx of CVA []  mini stroke   Hematologic: []  hx of cancer  Endocrine:   []  diabetes []  thyroid disease  GI [x]  acute cholecystitis/fatty liver   GU: []  CKD/renal failure []  HD--[]  M/W/F or []  T/T/S  Psychiatric: []  anxiety []  depression  Musculoskeletal: [x]  hx shoulder dislocation   Integumentary: []  rashes []  ulcers  Constitutional: []  fever  []  chills  Physical Examination  Vitals:   03/12/24 0605 03/12/24 0610  BP:  96/68  Pulse: 67 85  Resp:  18  Temp:  98.2 F (36.8 C)  SpO2: 95% 98%   Body mass index is 24.18 kg/m.  General:  WDWN in NAD Gait: Not observed HENT: WNL, normocephalic Pulmonary: normal non-labored breathing Cardiac: regular Abdomen:  soft, NT; aortic pulse is not palpable Skin: without rashes Vascular Exam/Pulses:  Right Left  Radial 2+ (normal) 2+ (normal)  Femoral absent absent  DP absent absent  PT absent absent   Musculoskeletal: no muscle wasting or atrophy  Neurologic: A&O X 3 Psychiatric:  The pt has Normal affect.   CBC    Component Value Date/Time   WBC 7.5 03/12/2024 0622   RBC 4.26 03/12/2024 0622   HGB 15.2 03/12/2024 0622   HCT 44.1 03/12/2024 0622   PLT 153 03/12/2024 0622   MCV 103.5 (H) 03/12/2024 0622   MCH 35.7 (H) 03/12/2024 0622   MCHC 34.5 03/12/2024 0622   RDW 13.6 03/12/2024 0622   LYMPHSABS 1.3 03/08/2024 0903   MONOABS 0.6 03/08/2024 0903   EOSABS 0.1  03/08/2024 0903   BASOSABS 0.1 03/08/2024 0903    BMET    Component Value Date/Time   NA 134 (L) 03/12/2024 0622   K 4.4 03/12/2024 0622   CL 101 03/12/2024 0622   CO2 22 03/12/2024 0622   GLUCOSE 100 (H) 03/12/2024 0622   BUN 16 03/12/2024 0622   CREATININE 0.92 03/12/2024 0622   CALCIUM  9.0 03/12/2024 0622   GFRNONAA >60 03/12/2024 0622    COAGS: Lab Results  Component Value Date   INR 0.9 03/08/2024   INR 0.9 02/13/2024     Non-Invasive Vascular Imaging:   ABI 02/13/2024 Right:  0.79 Left:  0.75 Monophasic waveforms bilaterally  CT a/p 02/13/2024 IMPRESSION: 1. 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 2. 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 3. Marked hepatic steatosis. 4. Radiopaque debris layering dependently within the gallbladder.   ASSESSMENT/PLAN: This is a 53 y.o. male with severe PAD/aorto-occlusive disease and 3.4 infrarenal AAA.   Aorto-occlusive disease/AAA -pt found to have nearly occlusive disease in the aorta at the bifurcation and this is complicated by a small 3.4cm infrarenal AAA.  He does not have classic severe rest pain or any thigh or calf claudication.   He does have what sounds like neuropathic symptoms and may benefit from Neurontin  if primary team feels this is appropriate.   -Given his age, he would most likely need open aortobifemoral bypass grafting, but required stenting of the LAD and now on Brilinta  and asa for 12 months.  -would benefit from statin, however, he does have elevated liver enzymes  currently.   -discussed with pt that he should continue with a graduated walking program.  He should protect his feet.  Discussed if he develops any non healing wounds on his feet, he should contact us  sooner.  Otherwise, most likely will  follow up with Dr. Sheree in 3-6  months for evaluation.   -pt is getting repeat ABI this afternoon.   Current smoker -discussed the importance of smoking cessation and PAD and that it does put him at increased risk for limb loss as well as cancers and pulmonary issues.    -Dr. Sheree to evaluate pt this afternoon and determine further planning.   Lucie Apt, PA-C [facility] [phone]  I have independently interviewed and examined patient and agree with PA assessment and plan above.  Very difficult situation as patient now recovering from LAD stenting and will require dual antiplatelet therapy with aspirin  and ticagrelor  uninterrupted for 12 months.  Cardiology not considering this NSTEMI but ischemic cardiomyopathy episode.  Given his young age we discussed that he would be best treated with aortobifemoral bypass but with underlying heart failure he will not be a candidate for such an invasive procedure.  He also has a small aneurysm above and occluded distal aorta which will need to be addressed at the time of revascularization.  Likely will need endovascular fix with stenting of the aorta and common iliac arteries may require brachial and bilateral femoral access.  He needs urgent smoking cessation currently smoking 2 packs/day prior to admission.  I will have him follow-up in 3 months and at that time hopefully he has cut down on cigarette smoking and we can discuss repeat CT angio at that time for further evaluation.  He does need to continue walking and protecting his feet diligently.  All of this was discussed with the patient and his significant other and they demonstrate good understanding.  Keigen Caddell C. Sheree, MD [facility] [place] Office: [phone] Pager: [phone]

## 2024-03-12 NOTE — TOC Initial Note (Signed)
 Transition of Care (TOC) - Initial/Assessment Note    Patient Details  Name: Maxwell Rosales. MRN: [id number] Date of Birth: [birth year]  Transition of Care [facility]) CM/SW Contact:    Sudie Erminio Deems, RN Phone Number: 03/12/2024, 4:32 PM  Clinical Narrative: Patient transferred from [facility]. Patient presented for abdominal pain. Patient post R/L heart cath. Benefits check submitted for Jardiance , Entresto , and Brilinta . Inpatient Case Manager discussed cost with patient and he states the co pays are expensive. Inpatient Case Manager will follow up with Unit Pharmacist to see if the patient is eligible for patient assistance. Patient states he has a PCP and has no issues with transportation. Inpatient Case Manager asked patient if a follow up appointment could be scheduled and he declined; states he can schedule the appointment. No further needs identified at this time.               Expected Discharge Plan: Home/Self Care Barriers to Discharge: No Barriers Identified   Patient Goals and CMS Choice Patient states their goals for this hospitalization and ongoing recovery are:: plans to return home   Choice offered to / list presented to : NA      Expected Discharge Plan and Services In-house Referral: NA Discharge Planning Services: CM Consult, Medication Assistance Post Acute Care Choice: NA Living arrangements for the past 2 months: Single Family Home                   DME Agency: NA       HH Arranged: NA   Prior Living Arrangements/Services Living arrangements for the past 2 months: Single Family Home Lives with:: Spouse Patient language and need for interpreter reviewed:: Yes Do you feel safe going back to the place where you live?: Yes      Need for Family Participation in Patient Care: Yes (Comment) Care giver support system in place?: Yes (comment)      Activities of Daily Living   ADL Screening (condition at time of admission) Independently performs  ADLs?: Yes (appropriate for developmental age) Is the patient deaf or have difficulty hearing?: No Does the patient have difficulty seeing, even when wearing glasses/contacts?: No Does the patient have difficulty concentrating, remembering, or making decisions?: No  Permission Sought/Granted Permission sought to share information with : Family Supports, Case Manager                Emotional Assessment Appearance:: Appears stated age Attitude/Demeanor/Rapport: Unable to Assess Affect (typically observed): Unable to Assess   Alcohol / Substance Use: Not Applicable Psych Involvement: No (comment)  Admission diagnosis:  Atypical chest pain [R07.89] Elevated LFTs [R79.89] Upper abdominal pain [R10.10] Acute on chronic systolic CHF (congestive heart failure) (HCC) [I50.23] Patient Active Problem List   Diagnosis Date Noted   NSVT (nonsustained ventricular tachycardia) (HCC) 03/10/2024   Non-ST elevation (NSTEMI) myocardial infarction (HCC) 03/09/2024   Cardiomyopathy (HCC) 03/09/2024   Alcohol abuse 03/09/2024   Continuous dependence on cigarette smoking 03/09/2024   Infrarenal abdominal aortic aneurysm (AAA) without rupture (HCC) 03/09/2024   Benign hypertension 03/09/2024   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 failure) (HCC) 03/09/2024   Acute combined systolic and diastolic heart failure (HCC) 03/08/2024   Ischemic cardiomyopathy 03/08/2024   Acute cholecystitis 03/08/2024   PAD (peripheral artery disease) (HCC) 03/08/2024   Alcohol induced fatty liver 03/08/2024   History of alcohol abuse 03/08/2024   Primary hypertension 03/08/2024   Tobacco abuse 03/08/2024   Acute on chronic  systolic CHF (congestive heart failure) (HCC) 03/08/2024   Elevated LFTs 03/07/2024   Acute alcoholic hepatitis 03/07/2024   Calculus of gallbladder with cholecystitis 03/06/2024   Folic acid  deficiency 03/01/2024   Primary insomnia 03/01/2024   Alcohol  withdrawal (HCC) 02/13/2024   Tobacco use 02/13/2024   Essential hypertension, benign 02/13/2024   Thrombocytopenia (HCC) 02/13/2024   Hyperbilirubinemia 02/13/2024   Coronary artery calcification 02/13/2024   Demand ischemia (HCC) 02/13/2024   LBBB (left bundle branch block) 02/13/2024   Aortoiliac occlusive disease (HCC) 02/13/2024   PCP:  Glennon Sand, NP Pharmacy:   [facility] - [place], [place] - [address] [place] [address] Phone: [phone] Fax: [phone]  [facility] [phone] - [place], [place] - [address] AT [facility] [address] [place] [address] Phone: [phone] Fax: [phone]     Social Drivers of Health (SDOH) Social History: SDOH Screenings   Food Insecurity: No Food Insecurity (03/08/2024)  Housing: Low Risk  (03/08/2024)  Transportation Needs: No Transportation Needs (03/08/2024)  Utilities: Not At Risk (03/08/2024)  Social Connections: Moderately Integrated (03/06/2024)  Tobacco Use: High Risk (03/11/2024)   SDOH Interventions:     Readmission Risk Interventions    03/07/2024   10:55 AM  Readmission Risk Prevention Plan  Medication Screening Complete  Transportation Screening Complete

## 2024-03-12 NOTE — Progress Notes (Signed)
 Cardiology Progress Note  Patient ID: Maxwell Rosales. MRN: [id number] DOB: April 30, 1971 Date of Encounter: 03/12/2024 Primary Cardiologist: None  Subjective   Chief Complaint: None.   HPI: Reports he is frustrated with inability to sleep.  He wants to go home.  Denies chest pain.  ROS:  All other ROS reviewed and negative. Pertinent positives noted in the HPI.     Telemetry  Overnight telemetry shows sinus rhythm 70s, which I personally reviewed.   ECG  The most recent ECG shows normal sinus rhythm heart rate 79, anterolateral T wave inversions, which I personally reviewed.   Physical Exam   Vitals:   03/12/24 0605 03/12/24 0610 03/12/24 0628 03/12/24 0827  BP:  96/68  (!) 106/54  Pulse: 67 85  78  Resp:  18  17  Temp:  98.2 F (36.8 C)  97.7 F (36.5 C)  TempSrc:  Oral  Oral  SpO2: 95% 98%  97%  Weight:   76.4 kg   Height:        Intake/Output Summary (Last 24 hours) at 03/12/2024 0858 Last data filed at 03/11/2024 1530 Gross per 24 hour  Intake 154.15 ml  Output 1500 ml  Net -1345.85 ml       03/12/2024    6:28 AM 03/11/2024    4:45 AM 03/10/2024    5:00 AM  Last 3 Weights  Weight (lbs) 168 lb 8 oz 167 lb 12.3 oz 168 lb 6.9 oz  Weight (kg) 76.431 kg 76.1 kg 76.4 kg    Body mass index is 24.18 kg/m.  General: Well nourished, well developed, in no acute distress Head: Atraumatic, normal size  Eyes: PEERLA, EOMI  Neck: Supple, no JVD Endocrine: No thryomegaly Cardiac: Normal S1, S2; RRR; no murmurs, rubs, or gallops Lungs: Clear to auscultation bilaterally, no wheezing, rhonchi or rales  Abd: Soft, nontender, no hepatomegaly  Ext: No edema, pulses 2+ Musculoskeletal: No deformities, BUE and BLE strength normal and equal Skin: Warm and dry, no rashes   Neuro: Alert and oriented to person, place, time, and situation, CNII-XII grossly intact, no focal deficits  Psych: Normal mood and affect   Cardiac Studies  LHC 03/11/2024 Conclusions: Significant  three-vessel coronary artery disease, as detailed below, including focal 80% mid LAD stenosis as well as chronic total occlusions of the mid LCx and proximal/mid RCA.  Distal LCx/OM1 and distal RCA are both collateralized. Low normal left and right heart filling pressures. Mildly to moderately reduced Fick cardiac output/index. Successful RFR-guided PCI to 80% mid LAD stenosis using Synergy 3.0 x 16 mm drug-eluting stent with 0% residual stenosis and TIMI-3 flow.  TTE 03/07/2024  1. Left ventricular ejection fraction, by estimation, is 35 to 40%. Left  ventricular ejection fraction by 3D volume is 40 %. The left ventricle has  moderately decreased function. The left ventricle demonstrates regional  wall motion abnormalities (see  scoring diagram/findings for description). The left ventricular internal  cavity size was moderately to severely dilated. Left ventricular diastolic  parameters are consistent with Grade I diastolic dysfunction (impaired  relaxation). Elevated left  ventricular end-diastolic pressure. The average left ventricular global  longitudinal strain is -11.0 %. The global longitudinal strain is  abnormal.   2. Right ventricular systolic function is normal. The right ventricular  size is normal. Tricuspid regurgitation signal is inadequate for assessing  PA pressure.   3. The mitral valve is normal in structure. Trivial mitral valve  regurgitation. No evidence of mitral stenosis.   4.  The aortic valve is tricuspid. Aortic valve regurgitation is not  visualized. No aortic stenosis is present.   5. The inferior vena cava is dilated in size with >50% respiratory  variability, suggesting right atrial pressure of 8 mmHg.   US  Arterial LE 02/13/2024 IMPRESSION: 1. Symmetrically abnormal bilateral ankle-brachial indices consistent with at least moderate underlying peripheral arterial disease. 2. Correlation with prior CT imaging confirms aortoiliac  occlusive disease.  Patient Profile  Maxwell Rosales. is a 53 y.o. male with alcohol abuse, tobacco abuse, PAD, hypertension, hyperlipidemia admitted on 03/08/2024 with chest pain and shortness of breath.  Found to have new onset systolic heart failure.  Assessment & Plan   # Acute systolic heart failure, EF 35 to 40% # Ischemic cardiomyopathy - Admitted with chest pain and shortness of breath.  Troponins remain elevated and flat.  I do not believe he has had a non-STEMI.  I think he just simply has systolic heart failure. - Left heart cath shows CTO of the circumflex and RCA.  He did have severe mid LAD stenosis status post PCI yesterday. - Cardiac output and index are stable.  Wedge pressure is normal.  No further diuresis needed. - Plan is to continue aspirin  and ticagrelor  for 1 year. - Continue carvedilol  3.125 mg twice daily and Entresto  24-26 mg twice daily.  We will add Jardiance  10 mg daily.  Blood pressure is a bit soft for Aldactone.  Can be titrated as an outpatient. - From my standpoint he can be discharged.  We will discuss when he can start a statin with hospital medicine. - Would discharge home on Lasix  20 mg as needed. - Would likely benefit from outpatient cardiac MRI to determine how much viable myocardium he has.  This can be completed as an outpatient.  # CAD status post PCI to the mid LAD/CTO RCA and circumflex # ACS ruled out - Plan is aspirin  ticagrelor  for 1 year. - Start statin when deemed appropriate by hospital medicine. - From my standpoint he did not have a non-STEMI.  He may resume driving starting tomorrow.  I discussed this with him during my interview today.  # PAD # AAA 3.4 cm - Nearly occluded bilateral internal iliac arteries.  No leg ulcerations.  Reports chronic leg pain.  He can see vascular surgery as an outpatient.  # Transaminitis # Acute alcoholic hepatitis? - HIDA scan negative for cholecystitis.  Elevated liver enzymes thought to be  related to alcohol abuse. - We will discuss when he can start a statin with hospital medicine.  # Alcohol abuse # Tobacco abuse - Cessation advised  [facility] will sign off.   The patient is ready for discharge today from a cardiac standpoint. Medication Recommendations: As above Other recommendations (labs, testing, etc): None Follow up as an outpatient: 1 to 2 weeks in [place]  For questions or updates, please contact [facility] Please consult [email or web address] for contact info under      Signed, Darryle T. Barbaraann, MD, [facility]  [facility]  03/12/2024 8:58 AM

## 2024-03-12 NOTE — Telephone Encounter (Signed)
 Patient Product/process development scientist completed.    The patient is insured through [employer]. Patient has [employer], may use a copay card, and/or apply for patient assistance if available.    Ran test claim for ticagrelor  (Brilinta ) 90 mg and the current 30 day co-pay is $40.00.  Ran test claim for Jardiance  10 mg and the current 30 day co-pay is $40.00.  Ran test claim for sacubitril -valsartan  (Entresto ) 24-26 mg and the current 30 day co-pay is $40.00.  This test claim was processed through [facility]- copay amounts may vary at other pharmacies due to pharmacy/plan contracts, or as the patient moves through the different stages of their insurance plan.     Reyes Sharps, CPHT Pharmacy Technician III Certified Patient Advocate [facility] Patient Advocate Team Direct Number: [phone]  Fax: [phone]

## 2024-03-12 NOTE — Progress Notes (Signed)
 ABI  has been completed. Refer to [facility] under chart review to view preliminary results.   03/12/2024  2:27 PM Marvie Brevik, Ricka BIRCH

## 2024-03-12 NOTE — Progress Notes (Signed)
 CARDIAC REHAB PHASE I      Post MI/stent education including restrictions, risk factors, exercise guidelines, antiplatelet therapy importance, MI booklet, NTG use, heart healthy diet, smoking cessation and CRP2 reviewed. All questions and concerns addressed. Will refer to AP for CRP2. CRP1 will sign off today.  Vaughn Asberry Hacking, RN BSN 03/12/2024 11:45 AM

## 2024-03-13 ENCOUNTER — Other Ambulatory Visit (HOSPITAL_COMMUNITY): Payer: Self-pay

## 2024-03-13 ENCOUNTER — Telehealth: Payer: Self-pay | Admitting: *Deleted

## 2024-03-13 LAB — LIPOPROTEIN A (LPA): Lipoprotein (a): 12.4 nmol/L (ref <75.0)

## 2024-03-13 NOTE — Transitions of Care (Post Inpatient/ED Visit) (Signed)
   03/13/2024  Name: Maxwell Rosales. MRN: [id number] DOB: Dec 05, 1970  Today's TOC FU Call Status: Today's TOC FU Call Status:: Unsuccessful Call (1st Attempt) Unsuccessful Call (1st Attempt) Date: 03/13/24  Attempted to reach the patient regarding the most recent Inpatient/ED visit.  Follow Up Plan: Additional outreach attempts will be made to reach the patient to complete the Transitions of Care (Post Inpatient/ED visit) call.   Cathlean Headland BSN RN [facility] Care Management Coordinator [email or web address] Direct Dial: [phone]  Fax: [phone] Website: [email or web address]

## 2024-03-14 ENCOUNTER — Encounter: Payer: Self-pay | Admitting: Family

## 2024-03-14 ENCOUNTER — Telehealth: Payer: Self-pay

## 2024-03-14 NOTE — Transitions of Care (Post Inpatient/ED Visit) (Signed)
 Today's TOC FU Call Status: Today's TOC FU Call Status:: Successful TOC FU Call Completed TOC FU Call Complete Date: 03/14/24 Patient's Name and Date of Birth confirmed.  Transition Care Management Follow-up Telephone Call Date of Discharge: 03/12/24 Discharge Facility: [name]) Type of Discharge: Inpatient Admission How have you been since you were released from the hospital?: Better Any questions or concerns?: No  Items Reviewed: Did you receive and understand the discharge instructions provided?: Yes Medications obtained,verified, and reconciled?: No Medications Not Reviewed Reasons::  (Patient declined.)  Medications Reviewed Today: Patient Declined Review.  Medications Reviewed Today   Medications were not reviewed in this encounter    03/14/24: [facility] RN CM post discharge outreach call was answered by patient. Patient very annoyed that he was called so early at 925 am and said he understood everything, will review his follow ups and medications later and will call back if has a question. Patient given call back number.   Declined further follow up calls or to complete most information. Inpatient care management note indicated patient declined assist to scheduled PCP HFU prior to discharge, but patient stated he would review his follow ups.    Bing Edison MSN, RN RN Case Sales executive Health  [facility] Office Hours M-F [phone] Direct Dial: [phone] Main Phone [phone]  Fax: [phone] [email or web address]     03/14/2024

## 2024-03-15 ENCOUNTER — Encounter (INDEPENDENT_AMBULATORY_CARE_PROVIDER_SITE_OTHER): Payer: Self-pay | Admitting: Gastroenterology

## 2024-03-18 ENCOUNTER — Telehealth: Payer: Self-pay | Admitting: Family

## 2024-03-18 NOTE — Progress Notes (Unsigned)
 Advanced Heart Failure Clinic Note   Referring Physician: 09/25 admission PCP: Glennon Sand, NP Cardiologist: None   Chief Complaint: shortness of breath   HPI:  Mr Yawn is a 53 y/o male with a history of Aortoiliac occlusive disease, HTN, HFrEF, PAD, CAD status post PCI to focal 80% mid LAD and CTO RCA and circumflex (09/25), alcohol induced fatty liver, tobacco use, NSVT.   Admitted 02/13/24 with tremulousness, chronic lower extremity numbness and pain, weakness, and lightheadedness. Last drink was the evening prior to arrival, EtOH level still elevated at 205 in the ED. Librium  taper and IV lorazepam  given. DT symptoms resolved and CIWA scores improved.   Admitted 03/06/24 with chest/upper abdominal pain and found to have evidence of acute cholecystitis. Elevated AST, ALT, alkaline phosphatase and bilirubin, concerning for acute cholecystitis. CT imaging confirms evidence of gallbladder sludge and appearance consistent with acute cholecystitis; this was supported on subsequent RUQ ultrasound, Surgery deferred. Troponin elevated at 18 with negative delta of 19. Chest pain resolved. Echo 03/07/24: EF 35-40%, G1DD, longitudinal strain -11.0%, normal RV. Symmetrically abnormal bilateral ABIs consistent with at least moderate disease. Transferred to [name] for cath.   Admitted 03/08/24 from OSH for cath. Underwent cardiac cath on 03/11/24 status post PCI to focal 80% mid LAD and CTO RCA and circumflex.  Aspirin , Brilinta  for 1 year, coreg , entresto . Resume statin outpatient if LFT's normalized. Iv diuresed. Vascular consulted for severe PAD. HIDA scan normal.    Entresto  was held beginning 03/14/24 due to reported low BP at home  He presents today for his initial TOC HF visit with a chief complaint of minimal shortness of breath. Has associated fatigue, occasional chest pain, rare pedal edema, improving pedal neuropathy. Doesn't weigh daily but does have scales at home. Says that his home  BP has been fluctuating.   Smoking 1 - 1.5 ppd cigarettes (was smoking 2ppd), no alcohol X 30 days, had been drinking all my life. Denies drug use.   Review of Systems: [y] = yes, [ ]  = no   General: Weight gain [ ] ; Weight loss [ ] ; Anorexia [ ] ; Fatigue [ y]; Fever [ ] ; Chills [ ] ; Weakness [ ]   Cardiac: Chest pain/pressure Davis.Dad ]; Resting SOB [ ] ; Exertional SOB Davis.Dad ]; Orthopnea [ ] ; Pedal Edema Davis.Dad ]; Palpitations [ ] ; Syncope [ ] ; Presyncope [ ] ; Paroxysmal nocturnal dyspnea[ ]   Pulmonary: Cough [ ] ; Wheezing[ ] ; Hemoptysis[ ] ; Sputum [ ] ; Snoring [ ]   GI: Vomiting[ ] ; Dysphagia[ ] ; Melena[ ] ; Hematochezia [ ] ; Heartburn[ ] ; Abdominal pain [ ] ; Constipation [ ] ; Diarrhea [ ] ; BRBPR [ ]   GU: Hematuria[ ] ; Dysuria [ ] ; Nocturia[ ]   Vascular: Pain in legs with walking [ ] ; Pain in feet with lying flat [ ] ; Non-healing sores [ ] ; Stroke [ ] ; TIA [ ] ; Slurred speech [ ] ;  Neuro: Headaches[ ] ; Vertigo[ ] ; Seizures[ ] ; Paresthesias[ ] ;Blurred vision [ ] ; Diplopia [ ] ; Vision changes [ ]   Ortho/Skin: Arthritis [ ] ; Joint pain [ ] ; Muscle pain [ ] ; Joint swelling [ ] ; Back Pain [ ] ; Rash [ ]   Psych: Depression[ ] ; Anxiety[ ]   Heme: Bleeding problems [ ] ; Clotting disorders [ ] ; Anemia [ ]   Endocrine: Diabetes [ ] ; Thyroid dysfunction[ ]    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 Swallow whole. 30 tablet 11   carvedilol  (COREG ) 3.125 MG tablet Take 1 tablet (3.125 mg total) by mouth 2 (two) times daily with a meal. 60 tablet 3   empagliflozin  (JARDIANCE ) 10 MG TABS tablet Take 1 tablet (10 mg total) by mouth daily. 30 tablet 3   folic acid  (FOLVITE ) 1 MG tablet Take 1 tablet (1 mg total) by mouth daily. 90 tablet 3   furosemide  (LASIX ) 20 MG tablet Take 1 tablet (20 mg total) by  mouth daily as needed for fluid or edema. 30 tablet 11   gabapentin  (NEURONTIN ) 100 MG capsule Take 1 capsule (100 mg total) by mouth 3 (three) times daily. 90 capsule 1   Misc Natural Products (NEURIVA PO) Take 1 capsule by mouth daily. For nerve pain     nicotine  (NICODERM CQ  - DOSED IN MG/24 HOURS) 21 mg/24hr patch Place 1 patch (21 mg total) onto the skin daily. 28 patch 0   nitroGLYCERIN  (NITROSTAT ) 0.4 MG SL tablet Place 1 tablet (0.4 mg total) under the tongue every 5 (five) minutes as needed for chest pain. 25 tablet 12   oxyCODONE  (OXY IR/ROXICODONE ) 5 MG immediate release tablet Take 1 tablet (5 mg total) by mouth every 6 (six) hours as needed for moderate pain (pain score 4-6) or severe pain (pain score 7-10). 30 tablet 0   sacubitril -valsartan  (ENTRESTO ) 24-26 MG Take 1 tablet by mouth 2 (two) times daily. 60 tablet 3   ticagrelor  (BRILINTA ) 90 MG TABS tablet Take 1 tablet (90 mg total) by mouth 2 (two) times daily. 60 tablet 3   zolpidem  (AMBIEN ) 10 MG tablet Take 1 tablet (10 mg total) by mouth at bedtime as needed for sleep. 30 tablet 0   No current facility-administered medications for this visit.    No Known Allergies    Social History   Socioeconomic History   Marital status: Married    Spouse name: Not on file   Number of children: Not on file   Years of education: Not on file   Highest education level: Not on file  Occupational History   Not on file  Tobacco Use   Smoking status: Every Day   Smokeless tobacco: Not on file  Vaping Use   Vaping status: Never Used  Substance and Sexual Activity   Alcohol use: Not Currently    Comment: patient reports no etoh since 01/2024 admission for alcoholic hepatitis/withdrawal   Drug use: No   Sexual activity: Yes  Other Topics Concern   Not on file  Social History Narrative   Not on file   Social Drivers of Health   Financial Resource Strain: Not on file  Food Insecurity: No Food Insecurity (03/08/2024)   Hunger  Vital Sign    Worried About Running Out of Food in the Last Year: Never true    Ran Out of Food in the Last Year: Never true  Transportation Needs: No Transportation Needs (03/08/2024)   PRAPARE - Administrator, Civil Service (Medical): No    Lack of Transportation (Non-Medical): No  Physical Activity: Not on file  Stress: Not on file  Social Connections: Moderately Integrated (03/06/2024)   Social Connection and Isolation Panel    Frequency of Communication with Friends and Family: More than three times a week    Frequency of Social Gatherings with Friends and Family: Three times a week    Attends Religious Services:  Never    Active Member of Clubs or Organizations: No    Attends Banker Meetings: 1 to 4 times per year    Marital Status: Married  Catering manager Violence: Not At Risk (03/08/2024)   Humiliation, Afraid, Rape, and Kick questionnaire    Fear of Current or Ex-Partner: No    Emotionally Abused: No    Physically Abused: No    Sexually Abused: No      Family History  Problem Relation Age of Onset   Colon cancer Neg Hx    Cirrhosis Neg Hx    Vitals:   03/19/24 1518  BP: 108/71  Pulse: 96  Resp: 18  SpO2: 99%  Weight: 181 lb 4 oz (82.2 kg)   Wt Readings from Last 3 Encounters:  03/19/24 181 lb 4 oz (82.2 kg)  03/12/24 168 lb 8 oz (76.4 kg)  03/06/24 183 lb (83 kg)   Lab Results  Component Value Date   CREATININE 0.92 03/12/2024   CREATININE 0.84 03/11/2024   CREATININE 0.80 03/10/2024   PHYSICAL EXAM:  General: Well appearing.  Cor: No JVD. Regular rhythm, rate.  Lungs: clear Abdomen: soft, nontender, nondistended. Extremities: trace edema bilateral lower legs Neuro:. Affect pleasant   ECG: not done   ASSESSMENT & PLAN:  1: Ischemic heart failure with reduced ejection fraction- - recent PCI 09/25 - NYHA class II - euvolemic - encouraged to weigh daily with parameters to call about discussed - Echo 03/07/24: EF  35-40%, G1DD, longitudinal strain -11.0%, normal RV. - continue carvedilol  3.125mg  BID - continue jardiance  10mg  daily - continue furosemide  20mg  daily PRN - resume entresto  as 1/2 tablet of 24/26mg  dose BID. If unable to tolerate the 1/2 tablet, will need to switch to ARB only - BP may not be able to tolerate MRA - need to discuss cMRI for viability but he's unsure if he will continue coming to the HF clinic due to distance from home. He has upcoming cardiology appointment so will send a message to her about discussing this further - BNP 02/13/24 was 308.0  2: HTN- - BP 108/70 - saw PCP Eusebio) 09/25 - BMET 03/12/24 reviewed: sodium 134, potassium 4.4, creatinine 0.92 & GFR >60  3: CAD- - PCI to focal 80% mid LAD and CTO RCA and circumflex (09/25) - to see cardiology Delberta) 10/25 - continue brilinta  90mg  BID for 1 year (09/26)  4: PAD- - to see vascular 10/25  5: Tobacco use- - smoking 1-1.5 ppd cigarettes - no alcohol for the last 30 days, has been drinking for years. Continued cessation emphasized   Return in 6 weeks, sooner if needed. Have asked that if he decides to not return and continue with local cardiology care, to just let us  know.   I spent 50 minutes reviewing records, interviewing/ examing patient and managing plan/ orders.    Ellouise DELENA Class, FNP 03/18/24

## 2024-03-18 NOTE — Telephone Encounter (Signed)
 Called to confirm/remind patient of their appointment at the [facility] Clinic on 03/19/24.   Appointment:   [x] Confirmed  [] Left mess   [] No answer/No voice mail  [] VM Full/unable to leave message  [] Phone not in service  Patient reminded to bring all medications and/or complete list.  Confirmed patient has transportation. Gave directions, instructed to utilize valet parking.

## 2024-03-19 ENCOUNTER — Encounter: Payer: Self-pay | Admitting: Family

## 2024-03-19 ENCOUNTER — Ambulatory Visit: Attending: Family | Admitting: Family

## 2024-03-19 VITALS — BP 108/71 | HR 96 | Resp 18 | Wt 181.2 lb

## 2024-03-19 DIAGNOSIS — K760 Fatty (change of) liver, not elsewhere classified: Secondary | ICD-10-CM | POA: Insufficient documentation

## 2024-03-19 DIAGNOSIS — I110 Hypertensive heart disease with heart failure: Secondary | ICD-10-CM | POA: Insufficient documentation

## 2024-03-19 DIAGNOSIS — I739 Peripheral vascular disease, unspecified: Secondary | ICD-10-CM | POA: Diagnosis not present

## 2024-03-19 DIAGNOSIS — Z7984 Long term (current) use of oral hypoglycemic drugs: Secondary | ICD-10-CM | POA: Diagnosis not present

## 2024-03-19 DIAGNOSIS — I5022 Chronic systolic (congestive) heart failure: Secondary | ICD-10-CM | POA: Diagnosis not present

## 2024-03-19 DIAGNOSIS — I2510 Atherosclerotic heart disease of native coronary artery without angina pectoris: Secondary | ICD-10-CM | POA: Insufficient documentation

## 2024-03-19 DIAGNOSIS — F17210 Nicotine dependence, cigarettes, uncomplicated: Secondary | ICD-10-CM | POA: Insufficient documentation

## 2024-03-19 DIAGNOSIS — Z955 Presence of coronary angioplasty implant and graft: Secondary | ICD-10-CM | POA: Diagnosis not present

## 2024-03-19 DIAGNOSIS — I10 Essential (primary) hypertension: Secondary | ICD-10-CM

## 2024-03-19 DIAGNOSIS — Z79899 Other long term (current) drug therapy: Secondary | ICD-10-CM | POA: Diagnosis not present

## 2024-03-19 DIAGNOSIS — Z7902 Long term (current) use of antithrombotics/antiplatelets: Secondary | ICD-10-CM | POA: Insufficient documentation

## 2024-03-19 DIAGNOSIS — Z720 Tobacco use: Secondary | ICD-10-CM

## 2024-03-19 MED ORDER — SACUBITRIL-VALSARTAN 24-26 MG PO TABS: 1.0000 | ORAL_TABLET | Freq: Every day | ORAL | Status: DC

## 2024-03-19 MED ORDER — SACUBITRIL-VALSARTAN 24-26 MG PO TABS: ORAL_TABLET | ORAL | Status: DC

## 2024-03-19 NOTE — Patient Instructions (Addendum)
 It was nice to meet you today!  Medication Changes:  START Entresto  24/26mg  (1/2 tab) two times daily      Thank you for choosing [facility].    At the Advanced Heart Failure Clinic, you and your health needs are our priority. We have a designated team specialized in the treatment of Heart Failure. This Care Team includes your primary Heart Failure Specialized Cardiologist (physician), Advanced Practice Providers (APPs- Physician Assistants and Nurse Practitioners), and Pharmacist who all work together to provide you with the care you need, when you need it.   You may see any of the following providers on your designated Care Team at your next follow up:  Dr. Toribio Fuel Dr. Ezra Shuck Dr. Ria Commander Dr. Morene Brownie Ellouise Class, FNP Jaun Bash, RPH-CPP  Please be sure to bring in all your medications bottles to every appointment.   Need to Contact Us :  If you have any questions or concerns before your next appointment please send us  a message through [address] or call our office at [phone].    TO LEAVE A MESSAGE FOR THE NURSE SELECT OPTION 2, PLEASE LEAVE A MESSAGE INCLUDING: YOUR NAME DATE OF BIRTH CALL BACK NUMBER REASON FOR CALL**this is important as we prioritize the call backs  YOU WILL RECEIVE A CALL BACK THE SAME DAY AS LONG AS YOU CALL BEFORE 4:00 PM

## 2024-03-20 ENCOUNTER — Telehealth (HOSPITAL_COMMUNITY): Payer: Self-pay

## 2024-03-20 NOTE — Telephone Encounter (Signed)
 Called patient regarding cardiac rehab referral with NSTEMI/Stent. He says he does not think he will be able to participate in the program at this time due to his work schedule. He says he will call us  if this changes.

## 2024-03-28 ENCOUNTER — Ambulatory Visit (INDEPENDENT_AMBULATORY_CARE_PROVIDER_SITE_OTHER): Admitting: Gastroenterology

## 2024-04-02 NOTE — Progress Notes (Unsigned)
 Cardiology Office Note    Date:  04/03/2024  ID:  Bergen Magner., DOB 1971-03-08, MRN [id number] Cardiologist: Diannah SHAUNNA Maywood, MD { :  History of Present Illness:    Maxwell Rosales. is a 53 y.o. male with past medical history of HTN, HLD, PAD, alcohol use and tobacco use who presents to the office today for hospital follow-up.  He was most recently admitted to [facility] in 02/2024 for an acute CHF exacerbation and his echocardiogram showed that his EF was reduced at 35 to 40%. He underwent cardiac catheterization which showed CTO of the LCx and RCA with mid LAD stenosis. The LCx and distal RCA with both collateralized but he did undergo successful PCI to the mid LAD and was recommended to be on DAPT with ASA and Brilinta  for at least 12 months. In regard to his cardiomyopathy, he was started on Coreg  3.125 mg twice daily, Entresto  24-26 mg twice daily and Jardiance  10 mg daily. During admission, he was found to have nearly occlusive disease along the aorta at the bifurcation along with a 3.4 cm infrarenal AAA. Outpatient vascular follow-up was recommended. Was not on a statin due to elevated LFTs.  He did follow-up with the AHF TOC clinic on 03/19/2024 and had been holding Entresto  due to hypotension. He denied any recurrent alcohol use since his admission. He was encouraged to resume Entresto  at 1/2 tablet and if unable to tolerate, would switch to an ARB only. The option of a cardiac MRI was reviewed to assess viability and was recommended to address at his general cardiology appointment.  In talk with the patient today, he reports overall feeling well over the past several months.  He denies any recent chest pain, dyspnea on exertion, palpitations, orthopnea or lower extremity edema.  Has not utilize Lasix  since his hospitalization.  Denies any lightheadedness, dizziness or presyncope. He has not consumed alcohol since his admission.    Studies Reviewed:   EKG: EKG is***  ordered today and demonstrates ***   EKG Interpretation Date/Time:    Ventricular Rate:    PR Interval:    QRS Duration:    QT Interval:    QTC Calculation:   R Axis:      Text Interpretation:         Echocardiogram: 02/2024 IMPRESSIONS     1. Left ventricular ejection fraction, by estimation, is 35 to 40%. Left  ventricular ejection fraction by 3D volume is 40 %. The left ventricle has  moderately decreased function. The left ventricle demonstrates regional  wall motion abnormalities (see  scoring diagram/findings for description). The left ventricular internal  cavity size was moderately to severely dilated. Left ventricular diastolic  parameters are consistent with Grade I diastolic dysfunction (impaired  relaxation). Elevated left  ventricular end-diastolic pressure. The average left ventricular global  longitudinal strain is -11.0 %. The global longitudinal strain is  abnormal.   2. Right ventricular systolic function is normal. The right ventricular  size is normal. Tricuspid regurgitation signal is inadequate for assessing  PA pressure.   3. The mitral valve is normal in structure. Trivial mitral valve  regurgitation. No evidence of mitral stenosis.   4. The aortic valve is tricuspid. Aortic valve regurgitation is not  visualized. No aortic stenosis is present.   5. The inferior vena cava is dilated in size with >50% respiratory  variability, suggesting right atrial pressure of 8 mmHg.   Comparison(s): No prior Echocardiogram.    R/LHC: 02/2024  Conclusions: Significant three-vessel coronary artery disease, as detailed below, including focal 80% mid LAD stenosis as well as chronic total occlusions of the mid LCx and proximal/mid RCA.  Distal LCx/OM1 and distal RCA are both collateralized. Low normal left and right heart filling pressures. Mildly to moderately reduced Fick cardiac output/index. Successful RFR-guided PCI to 80% mid LAD stenosis using Synergy 3.0 x  16 mm drug-eluting stent with 0% residual stenosis and TIMI-3 flow.   Recommendations: Gentle postcatheterization hydration in the setting of low filling pressures and soft blood pressure. Dual antiplatelet therapy with aspirin  and ticagrelor  for at least 12 months. Aggressive secondary prevention of coronary and peripheral arterial disease. Escalate goal-directed medical therapy for HFrEF due to ischemic cardiomyopathy.    Risk Assessment/Calculations:   {Does this patient have ATRIAL FIBRILLATION?[phone]} No BP recorded.  {Refresh Note OR Click here to enter BP  :1}***         Physical Exam:   VS:  There were no vitals taken for this visit.   Wt Readings from Last 3 Encounters:  03/19/24 181 lb 4 oz (82.2 kg)  03/12/24 168 lb 8 oz (76.4 kg)  03/06/24 183 lb (83 kg)     GEN: Well nourished, well developed in no acute distress NECK: No JVD; No carotid bruits CARDIAC: ***RRR, no murmurs, rubs, gallops RESPIRATORY:  Clear to auscultation without rales, wheezing or rhonchi  ABDOMEN: Appears non-distended. No obvious abdominal masses. EXTREMITIES: No clubbing or cyanosis. No edema.  Distal pedal pulses are 2+ bilaterally.   Assessment and Plan:   1. Coronary artery disease involving native coronary artery of native heart without angina pectoris - Recent cardiac catheterization showed CTO of the LCx and RCA with mid LAD stenosis. The LCx and distal RCA were both collateralized but he did undergo successful PCI to the mid LAD. ***  2. HFrEF (heart failure with reduced ejection fraction) (HCC) - Recent echocardiogram showed his EF was reduced at 35 to 40%. ***  3. PAD (peripheral artery disease) - Imaging during admission showed occlusive disease along the aorta at the bifurcation along with a 3.4 cm infrarenal AAA.  He does have scheduled follow-up with vascular surgery later this year.  4. Elevated LFTs - AST was at 32 and ALT at 82 when rechecked on 03/12/2024.  5.  Tobacco abuse ***    Signed, Laymon CHRISTELLA Qua, PA-C

## 2024-04-03 ENCOUNTER — Telehealth: Payer: Self-pay

## 2024-04-03 NOTE — Telephone Encounter (Signed)
 See patient message that was sent to Leita

## 2024-04-03 NOTE — Telephone Encounter (Signed)
 Copied from CRM #[id number]. Topic: General - Other >> Apr 03, 2024  8:39 AM Joesph NOVAK wrote: Reason for CRM: Patient called stating he would like to receive a call from [place], [place]. He did not give a reason. CB [phone]

## 2024-04-04 ENCOUNTER — Other Ambulatory Visit: Payer: Self-pay

## 2024-04-04 ENCOUNTER — Other Ambulatory Visit (HOSPITAL_COMMUNITY)
Admission: RE | Admit: 2024-04-04 | Discharge: 2024-04-04 | Disposition: A | Discharge status: Home / Self Care | Source: Ambulatory Visit | Attending: Student | Admitting: Student

## 2024-04-04 ENCOUNTER — Encounter: Payer: Self-pay | Admitting: Student

## 2024-04-04 ENCOUNTER — Ambulatory Visit (INDEPENDENT_AMBULATORY_CARE_PROVIDER_SITE_OTHER): Admitting: Student

## 2024-04-04 VITALS — BP 102/80 | HR 85 | Ht 70.0 in | Wt 180.2 lb

## 2024-04-04 DIAGNOSIS — Z87898 Personal history of other specified conditions: Secondary | ICD-10-CM

## 2024-04-04 DIAGNOSIS — I5020 Unspecified systolic (congestive) heart failure: Secondary | ICD-10-CM | POA: Insufficient documentation

## 2024-04-04 DIAGNOSIS — I739 Peripheral vascular disease, unspecified: Secondary | ICD-10-CM

## 2024-04-04 DIAGNOSIS — I2510 Atherosclerotic heart disease of native coronary artery without angina pectoris: Secondary | ICD-10-CM | POA: Insufficient documentation

## 2024-04-04 DIAGNOSIS — Z79899 Other long term (current) drug therapy: Secondary | ICD-10-CM

## 2024-04-04 DIAGNOSIS — R7989 Other specified abnormal findings of blood chemistry: Secondary | ICD-10-CM

## 2024-04-04 DIAGNOSIS — Z720 Tobacco use: Secondary | ICD-10-CM

## 2024-04-04 DIAGNOSIS — F5101 Primary insomnia: Secondary | ICD-10-CM

## 2024-04-04 LAB — COMPREHENSIVE METABOLIC PANEL WITH GFR
ALT: 12 U/L (ref 0–44)
AST: 18 U/L (ref 15–41)
Albumin: 3.7 g/dL (ref 3.5–5.0)
Alkaline Phosphatase: 96 U/L (ref 38–126)
Anion gap: 11 (ref 5–15)
BUN: 8 mg/dL (ref 6–20)
CO2: 24 mmol/L (ref 22–32)
Calcium: 9.4 mg/dL (ref 8.9–10.3)
Chloride: 100 mmol/L (ref 98–111)
Creatinine, Ser: 0.88 mg/dL (ref 0.61–1.24)
GFR, Estimated: >60 mL/min (ref >60)
Glucose, Bld: 99 mg/dL (ref 70–99)
Potassium: 4.1 mmol/L (ref 3.5–5.1)
Sodium: 136 mmol/L (ref 135–145)
Total Bilirubin: 0.3 mg/dL (ref 0.0–1.2)
Total Protein: 6.7 g/dL (ref 6.5–8.1)

## 2024-04-04 LAB — HEMOGLOBIN AND HEMATOCRIT, BLOOD
HCT: 43.1 % (ref 39.0–52.0)
Hemoglobin: 14.7 g/dL (ref 13.0–17.0)

## 2024-04-04 MED ORDER — ZOLPIDEM TARTRATE 10 MG PO TABS: 10.0000 mg | ORAL_TABLET | Freq: Every evening | ORAL | 0 refills | Status: DC | PRN

## 2024-04-04 NOTE — Patient Instructions (Signed)
 Medication Instructions:  Your physician recommends that you continue on your current medications as directed. Please refer to the Current Medication list given to you today.  *If you need a refill on your cardiac medications before your next appointment, please call your pharmacy*  Lab Work: Your physician recommends that you return for lab work in: CMET, H & H   If you have labs (blood work) drawn today and your tests are completely normal, you will receive your results only by: MyChart Message (if you have MyChart) OR A paper copy in the mail If you have any lab test that is abnormal or we need to change your treatment, we will call you to review the results.  Testing/Procedures: Your physician has requested that you have a cardiac MRI. Cardiac MRI uses a computer to create images of your heart as its beating, producing both still and moving pictures of your heart and major blood vessels. For further information please visit [email or web address]. Please follow the instruction sheet given to you today for more information.   Follow-Up: At [facility], you and your health needs are our priority.  As part of our continuing mission to provide you with exceptional heart care, our providers are all part of one team.  This team includes your primary Cardiologist (physician) and Advanced Practice Providers or APPs (Physician Assistants and Nurse Practitioners) who all work together to provide you with the care you need, when you need it.  Your next appointment:    December   Provider:   You may see Vishnu P Mallipeddi, MD or one of the following Advanced Practice Providers on your designated Care Team:   [place], PA-C  Sc[place]ia [place], [place] Olivia Pavy, [place]     We recommend signing up for the patient portal called MyChart.  Sign up information is provided on this After Visit Summary.  MyChart is used to connect with patients for Virtual Visits (Telemedicine).  Patients  are able to view lab/test results, encounter notes, upcoming appointments, etc.  Non-urgent messages can be sent to your provider as well.   To learn more about what you can do with MyChart, go to [email or web address].   Other Instructions Thank you for choosing [facility]!

## 2024-04-05 ENCOUNTER — Ambulatory Visit: Payer: Self-pay | Admitting: Student

## 2024-04-05 ENCOUNTER — Encounter: Payer: Self-pay | Admitting: Student

## 2024-04-05 DIAGNOSIS — Z79899 Other long term (current) drug therapy: Secondary | ICD-10-CM

## 2024-04-08 MED ORDER — ROSUVASTATIN CALCIUM 20 MG PO TABS: 20.0000 mg | ORAL_TABLET | Freq: Every day | ORAL | 3 refills | Status: DC

## 2024-04-08 NOTE — Telephone Encounter (Signed)
-----   Message from [place] sent at 04/05/2024  8:13 AM EDT ----- Please let the patient know that his electrolytes are within a normal range and kidney function remains stable. Liver function has now normalized with cessation of alcohol which is fantastic! Given  his known coronary artery disease, would recommend starting Crestor  20 mg daily to help with cholesterol. The most common side effect is worsening muscle aches/myalgias. If he develops this, would  encourage him to make us  aware so the dose of Crestor  can be adjusted. Would recheck FLP and LFT's in 2 months.  ----- Message ----- From: Interface, Lab In [place] Sent: 04/04/2024  12:32 PM EDT To: Laymon CHRISTELLA Qua, PA-C

## 2024-04-08 NOTE — Telephone Encounter (Signed)
 The patient has been notified of the result and verbalized understanding.  All questions (if any) were answered. Bernett Dorothyann LABOR, RN 04/08/2024 10:55 AM  Orders placed to [facility]

## 2024-04-10 ENCOUNTER — Encounter: Admitting: Vascular Surgery

## 2024-04-10 ENCOUNTER — Ambulatory Visit (HOSPITAL_COMMUNITY)

## 2024-04-10 ENCOUNTER — Encounter (INDEPENDENT_AMBULATORY_CARE_PROVIDER_SITE_OTHER): Payer: Self-pay | Admitting: Gastroenterology

## 2024-04-16 ENCOUNTER — Encounter: Admitting: Vascular Surgery

## 2024-04-29 ENCOUNTER — Other Ambulatory Visit: Payer: Self-pay

## 2024-04-29 DIAGNOSIS — I70213 Atherosclerosis of native arteries of extremities with intermittent claudication, bilateral legs: Secondary | ICD-10-CM

## 2024-04-30 ENCOUNTER — Encounter: Admitting: Family

## 2024-05-09 ENCOUNTER — Other Ambulatory Visit: Payer: Self-pay

## 2024-05-09 DIAGNOSIS — F5101 Primary insomnia: Secondary | ICD-10-CM

## 2024-05-09 NOTE — Telephone Encounter (Unsigned)
 Copied from CRM #[id number]. Topic: Clinical - Medication Refill >> May 09, 2024  4:00 PM Winona R wrote: Medication: oxyCODONE  (OXY IR/ROXICODONE ) 5 MG immediate release tablet [[id number]] zolpidem  (AMBIEN ) 10 MG tablet [[id number]]    Has the patient contacted their pharmacy? Yes (Agent: If no, request that the patient contact the pharmacy for the refill. If patient does not wish to contact the pharmacy document the reason why and proceed with request.) (Agent: If yes, when and what did the pharmacy advise?)  This is the patient's preferred pharmacy:  [facility] [phone] - [place], [place] - [address] AT [facility] [address] [place] [address] Phone: [phone] Fax: [phone]  Is this the correct pharmacy for this prescription? Yes If no, delete pharmacy and type the correct one.   Has the prescription been filled recently? Yes  Is the patient out of the medication? No  Has the patient been seen for an appointment in the last year OR does the patient have an upcoming appointment? Yes  Can we respond through MyChart? Yes  Agent: Please be advised that Rx refills may take up to 3 business days. We ask that you follow-up with your pharmacy.

## 2024-05-10 ENCOUNTER — Telehealth: Payer: Self-pay

## 2024-05-10 ENCOUNTER — Other Ambulatory Visit: Payer: Self-pay

## 2024-05-10 DIAGNOSIS — E538 Deficiency of other specified B group vitamins: Secondary | ICD-10-CM

## 2024-05-10 MED ORDER — FOLIC ACID 1 MG PO TABS: 1.0000 mg | ORAL_TABLET | Freq: Every day | ORAL | 3 refills | Status: DC

## 2024-05-10 MED ORDER — ZOLPIDEM TARTRATE 10 MG PO TABS: 10.0000 mg | ORAL_TABLET | Freq: Every evening | ORAL | 0 refills | Status: DC | PRN

## 2024-05-10 MED ORDER — OXYCODONE HCL 5 MG PO TABS: 5.0000 mg | ORAL_TABLET | Freq: Four times a day (QID) | ORAL | 0 refills | Status: DC | PRN

## 2024-05-10 NOTE — Telephone Encounter (Signed)
 I sent in the prescriptions by our office pills to [facility] on [address].  You will need to reach out to his cardiologist she had all other meds they manage sent over

## 2024-05-10 NOTE — Telephone Encounter (Signed)
 Copied from CRM #[id number]. Topic: Clinical - Medication Question >> May 09, 2024  4:04 PM Winona R wrote: Pt would like all medications switched to  [facility] [phone] - [place], [place] - [address] AT [facility] OF [address] [place] [address] Phone: [phone] Fax: [phone] Hours: Not open 24 hours

## 2024-05-13 NOTE — Telephone Encounter (Signed)
 Called cardiologist office and made them aware

## 2024-05-22 ENCOUNTER — Encounter (HOSPITAL_COMMUNITY): Payer: Self-pay

## 2024-05-28 ENCOUNTER — Other Ambulatory Visit: Payer: Self-pay | Admitting: Student

## 2024-05-28 ENCOUNTER — Ambulatory Visit (HOSPITAL_COMMUNITY)
Admission: RE | Admit: 2024-05-28 | Discharge: 2024-05-28 | Disposition: A | Discharge status: Home / Self Care | Source: Ambulatory Visit | Attending: Student | Admitting: Student

## 2024-05-28 DIAGNOSIS — I5020 Unspecified systolic (congestive) heart failure: Secondary | ICD-10-CM | POA: Diagnosis present

## 2024-05-28 MED ORDER — GADOBUTROL 1 MMOL/ML IV SOLN
10.0000 mL | Freq: Once | INTRAVENOUS | Status: AC | PRN
  Administered 2024-05-28: 10 mL via INTRAVENOUS

## 2024-06-04 ENCOUNTER — Telehealth: Payer: Self-pay

## 2024-06-04 NOTE — Telephone Encounter (Signed)
 Patient came by office and said can provider send in all medicine that he needs refill for next week he does not know all the name of them. Pharmacy: [facility]

## 2024-06-06 ENCOUNTER — Ambulatory Visit (HOSPITAL_COMMUNITY): Attending: Student | Admitting: Student

## 2024-06-06 ENCOUNTER — Ambulatory Visit (HOSPITAL_COMMUNITY): Admission: RE | Admit: 2024-06-06

## 2024-06-06 ENCOUNTER — Encounter: Payer: Self-pay | Admitting: Student

## 2024-06-06 VITALS — BP 118/70 | HR 66 | Ht 70.0 in | Wt 195.0 lb

## 2024-06-06 DIAGNOSIS — R7989 Other specified abnormal findings of blood chemistry: Secondary | ICD-10-CM | POA: Insufficient documentation

## 2024-06-06 DIAGNOSIS — I2510 Atherosclerotic heart disease of native coronary artery without angina pectoris: Secondary | ICD-10-CM | POA: Insufficient documentation

## 2024-06-06 DIAGNOSIS — Z87898 Personal history of other specified conditions: Secondary | ICD-10-CM | POA: Diagnosis present

## 2024-06-06 DIAGNOSIS — I739 Peripheral vascular disease, unspecified: Secondary | ICD-10-CM | POA: Diagnosis present

## 2024-06-06 DIAGNOSIS — I5020 Unspecified systolic (congestive) heart failure: Secondary | ICD-10-CM | POA: Diagnosis present

## 2024-06-06 MED ORDER — EMPAGLIFLOZIN 10 MG PO TABS: 10.0000 mg | ORAL_TABLET | Freq: Every day | ORAL | 3 refills | Status: AC

## 2024-06-06 MED ORDER — ROSUVASTATIN CALCIUM 20 MG PO TABS: 20.0000 mg | ORAL_TABLET | Freq: Every day | ORAL | 3 refills | Status: AC

## 2024-06-06 MED ORDER — SACUBITRIL-VALSARTAN 24-26 MG PO TABS: ORAL_TABLET | ORAL | 3 refills | Status: AC

## 2024-06-06 MED ORDER — TICAGRELOR 90 MG PO TABS: 90.0000 mg | ORAL_TABLET | Freq: Two times a day (BID) | ORAL | 3 refills | Status: AC

## 2024-06-06 MED ORDER — CARVEDILOL 3.125 MG PO TABS: 3.1250 mg | ORAL_TABLET | Freq: Two times a day (BID) | ORAL | 3 refills | Status: AC

## 2024-06-06 NOTE — Progress Notes (Signed)
 Cardiology Office Note    Date:  06/06/2024  ID:  Maxwell Rosales., DOB 1971-01-12, MRN [id number] Cardiologist: Diannah SHAUNNA Maywood, MD Cardiology APP:  Johnson Maxwell HERO, PA-C { : History of Present Illness:    Maxwell Rosales. is a 53 y.o. male with past medical history of CAD (s/p catheterization in 02/2024 showing CTO of LCx and RCA with mid LAD stenosis and he underwent successful PCI to the mid-LAD), chronic HFrEF (EF 35-40% by echo in 02/2024), HTN, HLD, PAD, history of alcohol use and tobacco use who presents to the office today for 110-month follow-up.  He was last examined by myself in 03/2024 following his recent hospitalization and denied any recent anginal symptoms at that time and volume status had been stable. He had not utilized PRN Lasix . It was recommended to arrange for a cardiac MRI to assess viability of his myocardium and he was continued on ASA 81 mg daily, Coreg  3.125 mg twice daily, Brilinta  90 mg twice daily, Jardiance  10 mg daily and Entresto  24-26 mg twice daily (only taking 1/2 tablet twice daily at that time given soft BP). LFTs had previously been elevated during admission but he was consuming a significant amount of alcohol at that time and had stopped since admission. LFT's had improved and he was started on Crestor  20 mg daily with follow-up FLP and LFT's were recommended in 2 months.  His cMRI was performed on 05/28/2024 and was consistent with ischemic cardiomyopathy with LVEF of 30% he did have subendocardial one third thickness scar in the basal to mid inferior wall with thinning and hypokinesis with scar in the basal to apical lateral wall and severe hypokinesis  Noted to have trivial to mild MR and low normal RVEF of 50%. He did have mildly elevated T1/ECV with normal T2 likely representing scar tissue from prior infarctions in the RCA and LCx.  In talking with the patient today, he reports overall feeling well since his last office visit. He is  active with his job and [place] [employer] [place] and says he is able to walk for longer distances without any chest pain or significant dyspnea. No specific orthopnea, PND or pitting edema. He continues to abstain from alcohol. Reports that he did stop taking ASA as he was trying to lessen the number of medications that he takes on a daily basis. He is unsure if taking Crestor  at this time.  Otherwise, has remained on Coreg , Jardiance , Entresto  and Brilinta .  Studies Reviewed:   EKG: EKG is not ordered today.  Echocardiogram: 02/2024 IMPRESSIONS     1. Left ventricular ejection fraction, by estimation, is 35 to 40%. Left  ventricular ejection fraction by 3D volume is 40 %. The left ventricle has  moderately decreased function. The left ventricle demonstrates regional  wall motion abnormalities (see  scoring diagram/findings for description). The left ventricular internal  cavity size was moderately to severely dilated. Left ventricular diastolic  parameters are consistent with Grade I diastolic dysfunction (impaired  relaxation). Elevated left  ventricular end-diastolic pressure. The average left ventricular global  longitudinal strain is -11.0 %. The global longitudinal strain is  abnormal.   2. Right ventricular systolic function is normal. The right ventricular  size is normal. Tricuspid regurgitation signal is inadequate for assessing  PA pressure.   3. The mitral valve is normal in structure. Trivial mitral valve  regurgitation. No evidence of mitral stenosis.   4. The aortic valve is  tricuspid. Aortic valve regurgitation is not  visualized. No aortic stenosis is present.   5. The inferior vena cava is dilated in size with >50% respiratory  variability, suggesting right atrial pressure of 8 mmHg.   Comparison(s): No prior Echocardiogram.   R/LHC: 02/2024 Conclusions: Significant three-vessel coronary artery disease, as detailed below, including focal 80%  mid LAD stenosis as well as chronic total occlusions of the mid LCx and proximal/mid RCA.  Distal LCx/OM1 and distal RCA are both collateralized. Low normal left and right heart filling pressures. Mildly to moderately reduced Fick cardiac output/index. Successful RFR-guided PCI to 80% mid LAD stenosis using Synergy 3.0 x 16 mm drug-eluting stent with 0% residual stenosis and TIMI-3 flow.   Recommendations: Gentle postcatheterization hydration in the setting of low filling pressures and soft blood pressure. Dual antiplatelet therapy with aspirin  and ticagrelor  for at least 12 months. Aggressive secondary prevention of coronary and peripheral arterial disease. Escalate goal-directed medical therapy for HFrEF due to ischemic cardiomyopathy.    cMRI: 05/2024 IMPRESSION: 1. Findings consistent with ischemic cardiomyopathy. Moderate LVE with LVEF 30%. Subendocardial 1/3 rd thickness scar in the basal to mid inferior wall with thinning and hypokinesis, 2/3 rd thickness scar in the basal to apical lateral wall with thinning and severe hypokinesis.   2.  Low normal RVEF 50%   3.  Trivial to mild MR with regurgitant volume 5 cc and RF 6%   4.  Mild LAE   5. Preserved cardiac output estimated at 6.2 L/min using flow analysis   6. Mildly elevated T1/ECV with normal T2 likely representing scar tissue from previous myocardial infarctions in the RCA and LCX distribution.     Physical Exam:   VS:  BP 118/70 (BP Location: Left Arm, Patient Position: Sitting, Cuff Size: Normal)   Pulse 66   Ht 5' 10 (1.778 m)   Wt 195 lb (88.5 kg)   SpO2 98%   BMI 27.98 kg/m    Wt Readings from Last 3 Encounters:  06/06/24 195 lb (88.5 kg)  04/04/24 180 lb 3.2 oz (81.7 kg)  03/19/24 181 lb 4 oz (82.2 kg)     GEN: Well nourished, well developed male appearing in no acute distress NECK: No JVD; No carotid bruits CARDIAC: RRR, no murmurs, rubs, gallops RESPIRATORY:  Clear to auscultation without  rales, wheezing or rhonchi  ABDOMEN: Appears non-distended. No obvious abdominal masses. EXTREMITIES: No clubbing or cyanosis. No pitting edema.  Distal pedal pulses are 2+ bilaterally.   Assessment and Plan:   1. Coronary artery disease involving native coronary artery of native heart without angina pectoris - Catheterization in 02/2024 showed a CTO of the LCx and RCA and he did undergo PCI of the mid-LAD. He denies any recent chest pain or dyspnea on exertion. - He did stop taking ASA for unclear reasons. Recommend resuming ASA 81 mg daily while continuing on Brilinta  90 mg twice daily. He is unsure if he is taking Crestor  and did recommend resuming this if not given his significant CAD.  2. HFrEF (heart failure with reduced ejection fraction) (HCC) - Echocardiogram in 02/2024 showed his EF was reduced at 35 to 40% and similar results by repeat cMRI with EF around 30% and noted to have scar tissue from prior infarctions  We reviewed referral back to the [facility] and he declines at this time as he does not want to see additional providers. I will send them a message to see if there are any additional recommendations in  the interim.  - He says that he overall feels well and does not wish to change his medications at this time. Therefore, will continue Coreg  3.125 mg twice daily, Jardiance  10 mg daily and Entresto  24-26 mg (taking 1/2 tablet twice daily).  We reviewed titrating Entresto  to a whole tablet given improvement in BP but he wishes to continue the half tablet at this time. He is not interested in adding additional medications at this time but Spironolactone would be an option if open to this in the future. He is concerned about changes in his insurance starting in 2026 and I encouraged him to reach out if his medications are unaffordable as he may need to switch to generic options. Briefly discussed possible EP referral for ICD placement and he is not keen on pursuing this currently.  3.  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 He is followed by Vascular Surgery and has a follow-up visit later this month.  4. Elevated LFTs - LFT's were elevated during his admission but this was in the setting of alcohol use. They had normalized with AST at 18 and ALT at 12 when checked in 03/2024. Will plan for follow-up FLP and LFT's at the time of his next visit if compliant with Crestor  in the interim.  5. History of alcohol use - He has refrained from alcohol consumption since his hospitalization in 02/2024. Was congratulated on this.   Signed, Maxwell CHRISTELLA Qua, PA-C

## 2024-06-06 NOTE — Telephone Encounter (Signed)
 Absolutely.

## 2024-06-06 NOTE — Patient Instructions (Addendum)
 Medication Instructions:  Your physician has recommended you make the following change in your medication:  Restart aspirin  81 mg daily Please make sure you are taking your rosuvastatin  20 mg daily Continue all other medications as prescribed.  Labwork: none  Testing/Procedures: none  Follow-Up: Your physician recommends that you schedule a follow-up appointment in: 3-4 months  Any Other Special Instructions Will Be Listed Below (If Applicable).  If you need a refill on your cardiac medications before your next appointment, please call your pharmacy.

## 2024-06-07 ENCOUNTER — Other Ambulatory Visit: Payer: Self-pay

## 2024-06-07 DIAGNOSIS — F5101 Primary insomnia: Secondary | ICD-10-CM

## 2024-06-07 MED ORDER — OXYCODONE HCL 5 MG PO TABS: 5.0000 mg | ORAL_TABLET | Freq: Four times a day (QID) | ORAL | 0 refills | Status: DC | PRN

## 2024-06-07 MED ORDER — ZOLPIDEM TARTRATE 10 MG PO TABS: 10.0000 mg | ORAL_TABLET | Freq: Every evening | ORAL | 0 refills | Status: DC | PRN

## 2024-06-07 NOTE — Telephone Encounter (Signed)
 Medications pending, due to medications being prescribed by us  is Controlled

## 2024-06-07 NOTE — Telephone Encounter (Signed)
 Medications filled by our office were sent to [facility] on [address].

## 2024-06-07 NOTE — Telephone Encounter (Signed)
 Noted.advised pt.

## 2024-06-10 ENCOUNTER — Other Ambulatory Visit: Payer: Self-pay

## 2024-06-10 DIAGNOSIS — I70213 Atherosclerosis of native arteries of extremities with intermittent claudication, bilateral legs: Secondary | ICD-10-CM

## 2024-06-10 DIAGNOSIS — F5101 Primary insomnia: Secondary | ICD-10-CM

## 2024-06-11 ENCOUNTER — Telehealth: Payer: Self-pay

## 2024-06-11 NOTE — Telephone Encounter (Signed)
 Copied from CRM #[id number]. Topic: Clinical - Prescription Issue >> Jun 11, 2024 11:06 AM Olam RAMAN wrote: Reason for CRM: pt spouse is calling about  gabapentin  (NEURONTIN ) 100 MG capsule zolpidem  (AMBIEN ) 10 MG tablet Was not at pharmacy and pt is out of meds

## 2024-06-12 ENCOUNTER — Ambulatory Visit: Admitting: Vascular Surgery

## 2024-06-13 ENCOUNTER — Other Ambulatory Visit: Payer: Self-pay

## 2024-06-13 DIAGNOSIS — I739 Peripheral vascular disease, unspecified: Secondary | ICD-10-CM

## 2024-06-13 DIAGNOSIS — F5101 Primary insomnia: Secondary | ICD-10-CM

## 2024-06-13 MED ORDER — GABAPENTIN 100 MG PO CAPS: 100.0000 mg | ORAL_CAPSULE | Freq: Three times a day (TID) | ORAL | 2 refills | Status: AC

## 2024-06-13 MED ORDER — ZOLPIDEM TARTRATE 10 MG PO TABS: 10.0000 mg | ORAL_TABLET | Freq: Every evening | ORAL | 2 refills | Status: AC | PRN

## 2024-06-13 NOTE — Telephone Encounter (Signed)
Medications sent to [facility]

## 2024-06-27 ENCOUNTER — Encounter: Payer: Self-pay | Admitting: Gastroenterology

## 2024-06-28 ENCOUNTER — Ambulatory Visit (HOSPITAL_COMMUNITY)
Admission: RE | Admit: 2024-06-28 | Discharge: 2024-06-28 | Disposition: A | Discharge status: Home / Self Care | Source: Ambulatory Visit | Attending: Vascular Surgery | Admitting: Vascular Surgery

## 2024-06-28 DIAGNOSIS — I70213 Atherosclerosis of native arteries of extremities with intermittent claudication, bilateral legs: Secondary | ICD-10-CM | POA: Insufficient documentation

## 2024-06-28 MED ORDER — IOHEXOL 350 MG/ML SOLN
100.0000 mL | Freq: Once | INTRAVENOUS | Status: AC | PRN
  Administered 2024-06-28: 100 mL via INTRAVENOUS

## 2024-07-03 ENCOUNTER — Encounter: Payer: Self-pay | Admitting: Vascular Surgery

## 2024-07-03 ENCOUNTER — Ambulatory Visit: Attending: Vascular Surgery | Admitting: Vascular Surgery

## 2024-07-03 VITALS — BP 106/70 | HR 91 | Temp 97.9°F | Wt 202.0 lb

## 2024-07-03 DIAGNOSIS — I70213 Atherosclerosis of native arteries of extremities with intermittent claudication, bilateral legs: Secondary | ICD-10-CM | POA: Insufficient documentation

## 2024-07-03 NOTE — Progress Notes (Signed)
 "  Patient ID: Maxwell Tober., male   DOB: Nov 19, 1970, 54 y.o.   MRN: [id number]  Reason for Consult: Follow-up   Referred by Glennon Sand, NP  Subjective:     HPI:  Maxwell Perry. is a 54 y.o. male recently hospital with a diagnosis of hemic cardiomyopathy and at the time he was having lower extremity pain which included burning and numbness from his thighs down.  He states that now he does have occasional numbness in his ankles and his feet feel cold but denies tissue loss or ulceration.  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  He does continue to smoke 1.5 packs/day.  He is able to walk further distances than prior to his most recent hospitalization but does occasionally have buttock pain as well as pain in his lower legs which subsides with rest.  Plan with cardiology is for 12 months of uninterrupted dual antiplatelet therapy from the time of his intervention in September 2025.  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   Shoulder dislocation    Family History  Problem Relation Age of Onset   Colon cancer Neg Hx    Cirrhosis Neg Hx    Past Surgical History:  Procedure Laterality Date   APPENDECTOMY     CORONARY PRESSURE/FFR STUDY N/A 03/11/2024   Procedure: CORONARY PRESSURE/FFR STUDY;  Surgeon: Mady Bruckner, MD;  Location: [facility];  Service: Cardiovascular;  Laterality: N/A;   CORONARY STENT INTERVENTION N/A 03/11/2024   Procedure: CORONARY STENT INTERVENTION;  Surgeon: Mady Bruckner, MD;  Location: [facility];  Service: Cardiovascular;  Laterality: N/A;   RIGHT/LEFT HEART CATH AND CORONARY ANGIOGRAPHY N/A 03/11/2024   Procedure: RIGHT/LEFT HEART CATH AND CORONARY ANGIOGRAPHY;  Surgeon: Mady Bruckner, MD;  Location: [facility];  Service: Cardiovascular;  Laterality: N/A;    Short Social History:  Social History   Tobacco Use   Smoking status: Every Day    Current  packs/day: 1.50    Average packs/day: 1.5 packs/day for 30.0 years (45.0 ttl pk-yrs)    Types: Cigarettes    Start date: 07/03/1994   Smokeless tobacco: Not on file  Substance Use Topics   Alcohol use: Not Currently    Comment: patient reports no etoh since 01/2024 admission for alcoholic hepatitis/withdrawal    Allergies[1]  Current Outpatient Medications  Medication Sig Dispense Refill   carvedilol  (COREG ) 3.125 MG tablet Take 1 tablet (3.125 mg total) by mouth 2 (two) times daily with a meal. 180 tablet 3   empagliflozin  (JARDIANCE ) 10 MG TABS tablet Take 1 tablet (10 mg total) by mouth daily. 90 tablet 3   furosemide  (LASIX ) 20 MG tablet Take 1 tablet (20 mg total) by mouth daily as needed for fluid or edema. 30 tablet 11   gabapentin  (NEURONTIN ) 100 MG capsule Take 1 capsule (100 mg total) by mouth 3 (three) times daily. 90 capsule 2   Melatonin 10 MG TABS Take 1 tablet by mouth at bedtime as needed.     nitroGLYCERIN  (NITROSTAT ) 0.4 MG SL tablet Place 1 tablet (0.4 mg total) under the tongue every 5 (five) minutes as needed for chest pain. 25 tablet 12   oxyCODONE  (OXY IR/ROXICODONE ) 5 MG immediate release tablet Take 1 tablet (5 mg total) by mouth every 6 (six) hours as needed for moderate pain (pain score 4-6) or severe pain (pain score 7-10). 30 tablet 0  rosuvastatin  (CRESTOR ) 20 MG tablet Take 1 tablet (20 mg total) by mouth at bedtime. 90 tablet 3   sacubitril -valsartan  (ENTRESTO ) 24-26 MG Take 1/2 tab two times daily 180 tablet 3   ticagrelor  (BRILINTA ) 90 MG TABS tablet Take 1 tablet (90 mg total) by mouth 2 (two) times daily. 180 tablet 3   zolpidem  (AMBIEN ) 10 MG tablet Take 1 tablet (10 mg total) by mouth at bedtime as needed for sleep. 30 tablet 2   No current facility-administered medications for this visit.    Review of Systems  Constitutional:  Constitutional negative. HENT: HENT negative.  Eyes: Eyes negative.  Respiratory: Positive for shortness of breath.   Cardiovascular: Positive for claudication.  Musculoskeletal: Positive for leg pain.  Skin: Skin negative.  Neurological: Neurological negative. Hematologic: Hematologic/lymphatic negative.        Objective:  Objective   Vitals:   07/03/24 0913  BP: 106/70  Pulse: 91  Temp: 97.9 F (36.6 C)  SpO2: 96%  Weight: 202 lb (91.6 kg)   Body mass index is 28.98 kg/m.  Physical Exam HENT:     Head: Normocephalic.  Cardiovascular:     Pulses:          Femoral pulses are 0 on the right side and 0 on the left side.      Popliteal pulses are 0 on the right side and 0 on the left side.  Abdominal:     General: Abdomen is flat.  Musculoskeletal:     Cervical back: Normal range of motion.     Right lower leg: No edema.     Left lower leg: No edema.  Skin:    General: Skin is warm and dry.  Neurological:     General: No focal deficit present.     Mental Status: He is alert.     Data: CTA reviewed with patient, formal read pending.  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     Assessment/Plan:     54 year old male with history now of ischemic cardiomyopathy status post coronary artery stenting now on dual antiplatelet therapy.  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  Thankfully he does not have symptoms at this time of leg pain and we can follow his ABIs expectantly in 1 year.  We also need to reevaluate his aneurysm in a couple years and any consideration of revascularization would require up-to-date CT angio.  We again discussed that he would be high risk for open aortic reconstructions but benefit from consideration of endovascular repair if at all possible.  We again discussed the need for urgent  smoking cessation but at this time he seems disinterested.    Penne Lonni Colorado MD [facility]      [1] No Known Allergies  "

## 2024-07-08 ENCOUNTER — Telehealth: Payer: Self-pay

## 2024-07-08 NOTE — Telephone Encounter (Signed)
 Pt stopped by to remind laura to refill his prescriptions by th e 15th. Pt has upcoming appt 1/27

## 2024-07-09 ENCOUNTER — Other Ambulatory Visit: Payer: Self-pay

## 2024-07-09 DIAGNOSIS — F5101 Primary insomnia: Secondary | ICD-10-CM

## 2024-07-09 MED ORDER — OXYCODONE HCL 5 MG PO TABS: 5.0000 mg | ORAL_TABLET | Freq: Four times a day (QID) | ORAL | 0 refills | Status: DC | PRN

## 2024-07-13 DIAGNOSIS — G894 Chronic pain syndrome: Secondary | ICD-10-CM

## 2024-07-13 MED ORDER — OXYCODONE HCL 5 MG PO TABS: 5.0000 mg | ORAL_TABLET | Freq: Four times a day (QID) | ORAL | 0 refills | Status: AC | PRN

## 2024-07-15 NOTE — Telephone Encounter (Signed)
 Called pt left voicemail to call back.

## 2024-07-22 ENCOUNTER — Telehealth: Payer: Self-pay | Admitting: Pharmacy Technician

## 2024-07-22 ENCOUNTER — Other Ambulatory Visit (HOSPITAL_COMMUNITY): Payer: Self-pay

## 2024-07-22 NOTE — Telephone Encounter (Signed)
 Pharmacy Patient Advocate Encounter   Received notification from [facility] that prior authorization for Oxycodone  5mg  is required/requested.   Insurance verification completed.   The patient is insured through [employer].   Per test claim: PA required; PA submitted to above mentioned insurance via Latent Key/confirmation #/EOC [employer] Status is pending

## 2024-07-22 NOTE — Telephone Encounter (Signed)
 Pharmacy Patient Advocate Encounter  Received notification from [facility] that Prior Authorization for Oxycodone  5mg  (IR) has been APPROVED from 07/22/24 to 01/18/25. Ran test claim, Copay is $0. This test claim was processed through [facility]- copay amounts may vary at other pharmacies due to pharmacy/plan contracts, or as the patient moves through the different stages of their insurance plan. Pt can get a max of a 30 day supply    PA #/Case ID/Reference #: [id number]

## 2024-07-23 ENCOUNTER — Ambulatory Visit

## 2024-08-16 ENCOUNTER — Ambulatory Visit: Payer: Self-pay

## 2024-09-12 ENCOUNTER — Ambulatory Visit: Admitting: Student

## 2024-09-20 ENCOUNTER — Ambulatory Visit: Admitting: Student

## 2025-01-01 ENCOUNTER — Ambulatory Visit

## 2025-01-01 ENCOUNTER — Ambulatory Visit (HOSPITAL_COMMUNITY)
